# Patient Record
Sex: Female | Born: 1950 | Race: White | Hispanic: No | Marital: Single | Smoking: Former smoker
Health system: Southern US, Community
[De-identification: ages and names within clinical notes are randomized; demographics above are authoritative.]

## PROBLEM LIST (undated history)

## (undated) DIAGNOSIS — Z87891 Personal history of nicotine dependence: Secondary | ICD-10-CM

## (undated) DIAGNOSIS — Z923 Personal history of irradiation: Secondary | ICD-10-CM

## (undated) DIAGNOSIS — C50919 Malignant neoplasm of unspecified site of unspecified female breast: Secondary | ICD-10-CM

## (undated) DIAGNOSIS — Z79811 Long term (current) use of aromatase inhibitors: Secondary | ICD-10-CM

## (undated) DIAGNOSIS — M8580 Other specified disorders of bone density and structure, unspecified site: Secondary | ICD-10-CM

## (undated) DIAGNOSIS — E785 Hyperlipidemia, unspecified: Secondary | ICD-10-CM

## (undated) DIAGNOSIS — Z972 Presence of dental prosthetic device (complete) (partial): Secondary | ICD-10-CM

## (undated) DIAGNOSIS — E559 Vitamin D deficiency, unspecified: Secondary | ICD-10-CM

## (undated) DIAGNOSIS — M1990 Unspecified osteoarthritis, unspecified site: Secondary | ICD-10-CM

## (undated) HISTORY — DX: Unspecified osteoarthritis, unspecified site: M19.90

## (undated) HISTORY — DX: Vitamin D deficiency, unspecified: E55.9

## (undated) HISTORY — DX: Hyperlipidemia, unspecified: E78.5

## (undated) HISTORY — DX: Presence of dental prosthetic device (complete) (partial): Z97.2

## (undated) HISTORY — DX: Personal history of irradiation: Z92.3

## (undated) HISTORY — DX: Malignant neoplasm of unspecified site of unspecified female breast: C50.919

## (undated) HISTORY — DX: Long term (current) use of aromatase inhibitors: Z79.811

## (undated) HISTORY — DX: Other specified disorders of bone density and structure, unspecified site: M85.80

## (undated) HISTORY — DX: Personal history of nicotine dependence: Z87.891

## (undated) HISTORY — PX: NECK SURGERY: SHX720

---

## 1977-08-29 HISTORY — PX: BREAST LUMPECTOMY: SHX2

## 1989-08-29 HISTORY — PX: APPENDECTOMY: SHX54

## 1989-08-29 HISTORY — PX: OVARIAN CYST REMOVAL: SHX89

## 1998-01-22 ENCOUNTER — Other Ambulatory Visit: Admission: RE | Admit: 1998-01-22 | Discharge: 1998-01-22 | Payer: Self-pay | Admitting: Obstetrics and Gynecology

## 1998-01-30 ENCOUNTER — Ambulatory Visit (HOSPITAL_COMMUNITY): Admission: RE | Admit: 1998-01-30 | Discharge: 1998-01-30 | Payer: Self-pay | Admitting: *Deleted

## 1998-02-26 ENCOUNTER — Ambulatory Visit (HOSPITAL_COMMUNITY): Admission: RE | Admit: 1998-02-26 | Discharge: 1998-02-26 | Payer: Self-pay | Admitting: Obstetrics and Gynecology

## 1999-02-12 ENCOUNTER — Ambulatory Visit (HOSPITAL_COMMUNITY): Admission: RE | Admit: 1999-02-12 | Discharge: 1999-02-12 | Payer: Self-pay | Admitting: Obstetrics and Gynecology

## 1999-02-23 ENCOUNTER — Encounter: Payer: Self-pay | Admitting: Obstetrics and Gynecology

## 1999-02-23 ENCOUNTER — Ambulatory Visit (HOSPITAL_COMMUNITY): Admission: RE | Admit: 1999-02-23 | Discharge: 1999-02-23 | Payer: Self-pay | Admitting: Obstetrics and Gynecology

## 1999-08-03 ENCOUNTER — Other Ambulatory Visit: Admission: RE | Admit: 1999-08-03 | Discharge: 1999-08-03 | Payer: Self-pay | Admitting: Obstetrics and Gynecology

## 2000-02-24 ENCOUNTER — Encounter: Payer: Self-pay | Admitting: Obstetrics and Gynecology

## 2000-02-24 ENCOUNTER — Ambulatory Visit (HOSPITAL_COMMUNITY): Admission: RE | Admit: 2000-02-24 | Discharge: 2000-02-24 | Payer: Self-pay | Admitting: Obstetrics and Gynecology

## 2000-03-08 ENCOUNTER — Emergency Department (HOSPITAL_COMMUNITY): Admission: EM | Admit: 2000-03-08 | Discharge: 2000-03-08 | Payer: Self-pay | Admitting: Emergency Medicine

## 2000-04-19 ENCOUNTER — Ambulatory Visit (HOSPITAL_COMMUNITY): Admission: RE | Admit: 2000-04-19 | Discharge: 2000-04-19 | Payer: Self-pay | Admitting: Family Medicine

## 2000-04-19 ENCOUNTER — Encounter: Payer: Self-pay | Admitting: Family Medicine

## 2001-02-28 ENCOUNTER — Ambulatory Visit (HOSPITAL_COMMUNITY): Admission: RE | Admit: 2001-02-28 | Discharge: 2001-02-28 | Payer: Self-pay | Admitting: Emergency Medicine

## 2001-02-28 ENCOUNTER — Encounter: Payer: Self-pay | Admitting: Obstetrics and Gynecology

## 2001-03-11 ENCOUNTER — Emergency Department (HOSPITAL_COMMUNITY): Admission: EM | Admit: 2001-03-11 | Discharge: 2001-03-12 | Payer: Self-pay | Admitting: Emergency Medicine

## 2001-03-14 ENCOUNTER — Emergency Department (HOSPITAL_COMMUNITY): Admission: EM | Admit: 2001-03-14 | Discharge: 2001-03-14 | Payer: Self-pay | Admitting: Emergency Medicine

## 2001-03-14 ENCOUNTER — Encounter: Payer: Self-pay | Admitting: Internal Medicine

## 2001-03-21 ENCOUNTER — Other Ambulatory Visit: Admission: RE | Admit: 2001-03-21 | Discharge: 2001-03-21 | Payer: Self-pay | Admitting: *Deleted

## 2002-03-07 ENCOUNTER — Encounter: Payer: Self-pay | Admitting: Obstetrics and Gynecology

## 2002-03-07 ENCOUNTER — Ambulatory Visit (HOSPITAL_COMMUNITY): Admission: RE | Admit: 2002-03-07 | Discharge: 2002-03-07 | Payer: Self-pay | Admitting: Obstetrics and Gynecology

## 2003-03-13 ENCOUNTER — Ambulatory Visit (HOSPITAL_COMMUNITY): Admission: RE | Admit: 2003-03-13 | Discharge: 2003-03-13 | Payer: Self-pay | Admitting: Obstetrics and Gynecology

## 2003-03-13 ENCOUNTER — Encounter: Payer: Self-pay | Admitting: Obstetrics and Gynecology

## 2004-07-01 ENCOUNTER — Other Ambulatory Visit: Admission: RE | Admit: 2004-07-01 | Discharge: 2004-07-01 | Payer: Self-pay | Admitting: Obstetrics and Gynecology

## 2004-07-06 ENCOUNTER — Ambulatory Visit (HOSPITAL_COMMUNITY): Admission: RE | Admit: 2004-07-06 | Discharge: 2004-07-06 | Payer: Self-pay | Admitting: *Deleted

## 2005-10-11 ENCOUNTER — Other Ambulatory Visit: Admission: RE | Admit: 2005-10-11 | Discharge: 2005-10-11 | Payer: Self-pay | Admitting: Obstetrics & Gynecology

## 2005-11-03 ENCOUNTER — Ambulatory Visit (HOSPITAL_COMMUNITY): Admission: RE | Admit: 2005-11-03 | Discharge: 2005-11-03 | Payer: Self-pay | Admitting: Obstetrics and Gynecology

## 2005-12-13 ENCOUNTER — Encounter (INDEPENDENT_AMBULATORY_CARE_PROVIDER_SITE_OTHER): Payer: Self-pay | Admitting: Specialist

## 2005-12-13 ENCOUNTER — Ambulatory Visit (HOSPITAL_BASED_OUTPATIENT_CLINIC_OR_DEPARTMENT_OTHER): Admission: RE | Admit: 2005-12-13 | Discharge: 2005-12-13 | Payer: Self-pay | Admitting: Obstetrics & Gynecology

## 2005-12-13 HISTORY — PX: HYSTEROSCOPY W/D&C: SHX1775

## 2006-06-12 ENCOUNTER — Ambulatory Visit (HOSPITAL_COMMUNITY): Admission: RE | Admit: 2006-06-12 | Discharge: 2006-06-13 | Payer: Self-pay | Admitting: Neurosurgery

## 2006-06-12 HISTORY — PX: ANTERIOR CERVICAL DISCECTOMY: SHX1160

## 2006-10-19 ENCOUNTER — Other Ambulatory Visit: Admission: RE | Admit: 2006-10-19 | Discharge: 2006-10-19 | Payer: Self-pay | Admitting: Obstetrics & Gynecology

## 2006-11-06 ENCOUNTER — Ambulatory Visit (HOSPITAL_COMMUNITY): Admission: RE | Admit: 2006-11-06 | Discharge: 2006-11-06 | Payer: Self-pay | Admitting: Obstetrics and Gynecology

## 2007-11-05 ENCOUNTER — Other Ambulatory Visit: Admission: RE | Admit: 2007-11-05 | Discharge: 2007-11-05 | Payer: Self-pay | Admitting: Obstetrics and Gynecology

## 2008-11-10 ENCOUNTER — Other Ambulatory Visit: Admission: RE | Admit: 2008-11-10 | Discharge: 2008-11-10 | Payer: Self-pay | Admitting: Obstetrics & Gynecology

## 2011-01-14 NOTE — Op Note (Signed)
NAME:  Cindy Skinner, Cindy Skinner              ACCOUNT NO.:  [id number]   MEDICAL RECORD NO.:  [id number]          PATIENT TYPE:  AMB   LOCATION:  [facility]                         FACILITY:  [facility]   PHYSICIAN:  M. Leda Quail, MD  DATE OF BIRTH:  [birth year]   DATE OF PROCEDURE:  12/13/2005  DATE OF DISCHARGE:                                 OPERATIVE REPORT   PREOPERATIVE DIAGNOSES:  1.  60 year-old G0 single white female with endometrial cells on a      Pap smear.  2.  Possible endometrial polyp.  3.  Unable to do sonohistogram in the office secondary to cervical stenosis      and nulliparity.   POSTOPERATIVE DIAGNOSES:  1.  60 year-old G0 single white female with endometrial cells on a      Pap smear.  2.  Possible endometrial polyp.  3.  Unable to do sonohistogram in the office secondary to cervical stenosis      and nulliparity.   PROCEDURE:  Hysteroscopy with D&C.   SURGEON:  M. Leda Quail, MD.   ANESTHESIA:  General endotracheal.   SPECIMENS:  Endometrial curettings.   EBL:  Minimal.   URINE OUTPUT:  No catheterization was performed at the end of procedure.   FLUIDS:  Approximately 600 mL of LR.   FLUID DEFICIT:  Sorbitol 55 mL.   COMPLICATIONS:  None.   INDICATIONS:  Ms. Lanese is a 60 year old G0 single white female with  endometrial cells on a Pap smear.  The endometrial biopsy as well as  sonohistogram was attempted; however, the patient is nulliparous and I could  not dilate the cervix in the clinic.  After multiple attempts were made,  decision was to go ahead and proceed with a hysteroscopy and D&C in the  operating room while the patient was under anesthesia.  The patient was in  agreement with this.  Risks and benefits were discussed in the clinic.   PROCEDURE:  Patient was taken to the operating room.  She was placed in the  supine position, in the dorsal lithotomy position, was placed on the Central  stirrups.  The abdomen and perineum  entered, the thighs and vagina were  prepped and draped in normal sterile fashion.  A red rubber Foley catheter  was used to drain the bladder of all urine.  A short heavy weighted speculum  was placed in the posterior aspect of vagina.  The Sims retractor was used  to visualize the cervix.  The anterior lip of the cervix was grasped with a  single-tooth tenaculum.  The cervix was retracted toward the operator to  stretch the uterus and the cervix and using Pratt dilators the cervix  actually dilated fairly easily considering the difficulty with which I  encountered in the clinic.  The cervix was dilated up to a #22 Pratt  dilator.  The uterus was sounded at this point to 6-3/4 cm.  Then using a  diagnostic hysteroscope with a 12 degree angle tip attempt was made to pass  this through the dilated cervix.  There was some difficulty and so  the  cervix was dilated up to a #23.  At this point, the scope could be placed  through the cervical canal without difficulty.  The cervical canal and  endometrial cavity was visualized and photo documentation was made.  There  was no distinct endometrial polyps that were noted.  There was some fluffy  endometrium present, however.  At this time decision was made to go ahead  and proceed with D&C for endometrial sampling.  With a #1 smooth curet, the  endometrial cavity was curetted in all quadrants until a rough, gritty  texture was noted.  At this point, the hysteroscope was placed back into the  cervical canal.  Sorbitol was used as the hysteroscopic medium and an  automatic counter attached to sunction was present to ensure good deficit  calculation was made.  At this point, the endometrial cavity appeared rough  as it had been curetted; again, no distinct endometrial polyp was noted.  At  this point, I felt that the procedure should be terminated as no polyp was  present and a good endometrial sampling had been obtained.  Hysteroscope was  removed from  the cervix, the sorbitol deficit was 55 mL.  The single-tooth  tenaculum was removed from the anterior lip of the cervix and there was some  bleeding noted from the tenaculum sites.  Silver nitrate was used to obtain  excellent hemostasis at the tenaculum sites.  All instruments were removed  from the vagina.  The patient was positioned back in the supine position.  Perineum was cleaned of Betadine.   Sponge, lap, needle and instrument counts were correct x2.  The patient  tolerates procedure well.  She was extubated from general endotracheal  anesthesia without difficulty and taken to the recovery room in stable  condition.      Lum Keas, MD  Electronically Signed     MSM/MEDQ  D:  12/13/2005  T:  12/13/2005  Job:  [id number]

## 2011-01-14 NOTE — Op Note (Signed)
NAME:  Cindy Skinner, Cindy Skinner              ACCOUNT NO.:  [id number]   MEDICAL RECORD NO.:  [id number]          PATIENT TYPE:  AMB   LOCATION:  [facility]                          FACILITY:  [facility]   PHYSICIAN:  Donalee Citrin, M.D.        DATE OF BIRTH:  11/24/1950   DATE OF PROCEDURE:  06/12/2006  DATE OF DISCHARGE:                                 OPERATIVE REPORT   PREOPERATIVE DIAGNOSIS:  C7 radiculopathy, left.   POSTOPERATIVE DIAGNOSIS:  C7 radiculopathy, left.   PROCEDURE:  Anterior cervical diskectomy and fusion, C6-7, using an 8-mm  Synthes bone wedge and a 25-mm Venture plate with four 13-mm variable-angle  screws.   SURGEON:  Donalee Citrin, M.D.   ANESTHESIA:  General endotracheal.   HISTORY OF PRESENT ILLNESS:  The patient is a very pleasant 60 year old  female, who has had longstanding neck and left arm pain, going down the  first fingers of the left hand, consistent with a C7 neuropraxia.  The  patient had weakness of her triceps.  The patient had presented with MRI  scan showing compression of the C7 nerve roots, as well as EMG showing C7  radiculopathy.  Given the patient's failure of conservative treatment, the  patient was recommended anterior cervical diskectomy and fusion.  The risks  and benefits were explained to the patient.  She understands and agrees to  proceed forward.   DESCRIPTION OF PROCEDURE:  The patient was brought to the OR and was induced  under general anesthesia and was positioned supine with the neck in slight  extension  with 5 pounds of halter traction.  The right side of her neck was  prepped and drape in the usual sterile fashion.  After prepping and draping,  we localized the C6-7 interspace.  A curvilinear incision was made just off  the midline to the anterior border of the sternocleidomastoid.  The  superficial layer of the platysma was dissected out and divided  longitudinally.  The avascular plane between the sternocleidomastoid and the  strap muscles  was developed down to prevertebral fascia.  The prevertebral  fascia was dissected with the Kitners.  Intraoperative x-ray confirmed  localization at the C6-7 disk space.  Annulotomy was then achieved with a 10-  blade scalpel, and then the longus colli was reflected laterally and a self-  retaining retractor was placed.  At this point, the annulotomy was extended.  A large osteophyte coming off the C6 vertebral body was bitten away with a 3-  mm Kerrison punch.  Then, using a high-speed drill, the interspace was  drilled down to the posterior annulus and the posterior osteophytic complex.  At this point, the operating microscope was draped and brought into the  field for microscope illumination.  The large osteophyte coming from the C6  vertebral body was underbitten with a 1-mm Kerrison punch, as well as the  endplate behind C7, which was also underbitten to expose the posterior  longitudinal ligament, which was removed in piecemeal fashion, exposing the  thecal sac.  The left side of the thecal sac, spinal canal and proximal left  C7 neural foramina were under a significant amount of compression from a  large osteophyte coming off the uncinate process and the endplates.  This  was all underbitten with the 2 and 3-mm Kerrison punches.,  The C7 nerve  root was identified and skeletonized significantly out its foramina.  The C7  pedicle was identified and the foramina was cleaned up to further  skeletonized the nerve root along the course of the pedicle.  Then, the  endplates were further underbitten, decompressing the proximal right C7  neural foramen.  And at this point, the endplates were scraped.  The thecal  sac, central canal and both neural foramen we widely decompressed.  Attention was taken to the placement of the interbody wedge.  An 8-mm graft  was sized and inserted 1 mm deep to the anterior vertebral body level.  A 25-  mm Venture plating system was then inserted.  All screws  were drilled and  tapped in place.  All screws had excellent purchase.  The locking mechanism  was engaged.  The wound was copiously irrigated and meticulous hemostasis  was maintained.  The platysma was reapproximated with 3-0 interrupted  Vicryl, and the skin was closed with a running, 4-0 subcuticular.  Benzoin  and Steri-Strips were applied.  The patient went to the recovery room in  stable condition.  At the end of the case, needle counts were correct.           ______________________________  Donalee Citrin, M.D.     GC/MEDQ  D:  06/12/2006  T:  06/12/2006  Job:  [id number]

## 2011-11-08 ENCOUNTER — Other Ambulatory Visit: Payer: Self-pay | Admitting: Obstetrics and Gynecology

## 2011-11-08 DIAGNOSIS — Z780 Asymptomatic menopausal state: Secondary | ICD-10-CM

## 2011-11-08 DIAGNOSIS — Z1231 Encounter for screening mammogram for malignant neoplasm of breast: Secondary | ICD-10-CM

## 2011-12-08 ENCOUNTER — Ambulatory Visit (HOSPITAL_COMMUNITY)
Admission: RE | Admit: 2011-12-08 | Discharge: 2011-12-08 | Disposition: A | Discharge status: Home / Self Care | Payer: BC Managed Care – PPO | Source: Ambulatory Visit | Attending: Obstetrics and Gynecology | Admitting: Obstetrics and Gynecology

## 2011-12-08 DIAGNOSIS — Z780 Asymptomatic menopausal state: Secondary | ICD-10-CM

## 2011-12-08 DIAGNOSIS — Z1231 Encounter for screening mammogram for malignant neoplasm of breast: Secondary | ICD-10-CM

## 2011-12-08 DIAGNOSIS — M8580 Other specified disorders of bone density and structure, unspecified site: Secondary | ICD-10-CM

## 2011-12-08 DIAGNOSIS — Z13820 Encounter for screening for osteoporosis: Secondary | ICD-10-CM | POA: Insufficient documentation

## 2011-12-08 HISTORY — DX: Other specified disorders of bone density and structure, unspecified site: M85.80

## 2011-12-26 ENCOUNTER — Other Ambulatory Visit: Payer: Self-pay | Admitting: Obstetrics and Gynecology

## 2011-12-26 DIAGNOSIS — R928 Other abnormal and inconclusive findings on diagnostic imaging of breast: Secondary | ICD-10-CM

## 2011-12-28 ENCOUNTER — Other Ambulatory Visit: Payer: Self-pay | Admitting: Obstetrics and Gynecology

## 2011-12-28 ENCOUNTER — Ambulatory Visit
Admission: RE | Admit: 2011-12-28 | Discharge: 2011-12-28 | Disposition: A | Discharge status: Home / Self Care | Payer: BC Managed Care – PPO | Source: Ambulatory Visit | Attending: Obstetrics and Gynecology | Admitting: Obstetrics and Gynecology

## 2011-12-28 DIAGNOSIS — R928 Other abnormal and inconclusive findings on diagnostic imaging of breast: Secondary | ICD-10-CM

## 2011-12-28 DIAGNOSIS — C50919 Malignant neoplasm of unspecified site of unspecified female breast: Secondary | ICD-10-CM

## 2011-12-28 DIAGNOSIS — N6310 Unspecified lump in the right breast, unspecified quadrant: Secondary | ICD-10-CM

## 2011-12-28 HISTORY — DX: Malignant neoplasm of unspecified site of unspecified female breast: C50.919

## 2012-01-02 ENCOUNTER — Ambulatory Visit
Admission: RE | Admit: 2012-01-02 | Discharge: 2012-01-02 | Disposition: A | Discharge status: Home / Self Care | Payer: BC Managed Care – PPO | Source: Ambulatory Visit | Attending: Obstetrics and Gynecology | Admitting: Obstetrics and Gynecology

## 2012-01-02 ENCOUNTER — Other Ambulatory Visit: Payer: Self-pay | Admitting: Obstetrics and Gynecology

## 2012-01-02 DIAGNOSIS — N6310 Unspecified lump in the right breast, unspecified quadrant: Secondary | ICD-10-CM

## 2012-01-02 DIAGNOSIS — N63 Unspecified lump in unspecified breast: Secondary | ICD-10-CM

## 2012-01-02 HISTORY — PX: BREAST BIOPSY: SHX20

## 2012-01-03 ENCOUNTER — Ambulatory Visit
Admission: RE | Admit: 2012-01-03 | Discharge: 2012-01-03 | Disposition: A | Discharge status: Home / Self Care | Payer: BC Managed Care – PPO | Source: Ambulatory Visit | Attending: Obstetrics and Gynecology | Admitting: Obstetrics and Gynecology

## 2012-01-03 ENCOUNTER — Other Ambulatory Visit: Payer: Self-pay | Admitting: Obstetrics and Gynecology

## 2012-01-03 DIAGNOSIS — N63 Unspecified lump in unspecified breast: Secondary | ICD-10-CM

## 2012-01-03 DIAGNOSIS — C50911 Malignant neoplasm of unspecified site of right female breast: Secondary | ICD-10-CM

## 2012-01-05 ENCOUNTER — Other Ambulatory Visit: Payer: Self-pay | Admitting: *Deleted

## 2012-01-05 ENCOUNTER — Telehealth: Payer: Self-pay | Admitting: *Deleted

## 2012-01-05 DIAGNOSIS — C50311 Malignant neoplasm of lower-inner quadrant of right female breast: Secondary | ICD-10-CM | POA: Insufficient documentation

## 2012-01-05 DIAGNOSIS — C50319 Malignant neoplasm of lower-inner quadrant of unspecified female breast: Secondary | ICD-10-CM

## 2012-01-05 NOTE — Telephone Encounter (Signed)
Confirmed [facility] for 01/11/12 at 0800 .  Instructions and contact information given.

## 2012-01-07 ENCOUNTER — Other Ambulatory Visit: Payer: BC Managed Care – PPO

## 2012-01-10 ENCOUNTER — Ambulatory Visit
Admission: RE | Admit: 2012-01-10 | Discharge: 2012-01-10 | Disposition: A | Discharge status: Home / Self Care | Payer: BC Managed Care – PPO | Source: Ambulatory Visit | Attending: Obstetrics and Gynecology | Admitting: Obstetrics and Gynecology

## 2012-01-10 DIAGNOSIS — C50911 Malignant neoplasm of unspecified site of right female breast: Secondary | ICD-10-CM

## 2012-01-10 MED ORDER — GADOBENATE DIMEGLUMINE 529 MG/ML IV SOLN: 14.0000 mL | Freq: Once | INTRAVENOUS | Status: AC | PRN

## 2012-01-11 ENCOUNTER — Ambulatory Visit (HOSPITAL_BASED_OUTPATIENT_CLINIC_OR_DEPARTMENT_OTHER): Payer: BC Managed Care – PPO | Admitting: Surgery

## 2012-01-11 ENCOUNTER — Encounter: Payer: Self-pay | Admitting: *Deleted

## 2012-01-11 ENCOUNTER — Other Ambulatory Visit (HOSPITAL_COMMUNITY): Payer: Self-pay | Admitting: Surgery

## 2012-01-11 ENCOUNTER — Encounter: Payer: Self-pay | Admitting: Oncology

## 2012-01-11 ENCOUNTER — Ambulatory Visit
Admission: RE | Admit: 2012-01-11 | Discharge: 2012-01-11 | Disposition: A | Discharge status: Home / Self Care | Payer: BC Managed Care – PPO | Source: Ambulatory Visit | Attending: Radiation Oncology | Admitting: Radiation Oncology

## 2012-01-11 ENCOUNTER — Ambulatory Visit: Payer: BC Managed Care – PPO | Attending: Surgery | Admitting: Physical Therapy

## 2012-01-11 ENCOUNTER — Ambulatory Visit (HOSPITAL_BASED_OUTPATIENT_CLINIC_OR_DEPARTMENT_OTHER): Payer: BC Managed Care – PPO | Admitting: Oncology

## 2012-01-11 ENCOUNTER — Encounter: Payer: Self-pay | Admitting: Specialist

## 2012-01-11 ENCOUNTER — Ambulatory Visit: Payer: BC Managed Care – PPO

## 2012-01-11 ENCOUNTER — Other Ambulatory Visit (HOSPITAL_BASED_OUTPATIENT_CLINIC_OR_DEPARTMENT_OTHER): Payer: BC Managed Care – PPO

## 2012-01-11 VITALS — BP 111/75 | HR 74 | Temp 98.1°F | Ht 66.2 in | Wt 165.1 lb

## 2012-01-11 DIAGNOSIS — C50319 Malignant neoplasm of lower-inner quadrant of unspecified female breast: Secondary | ICD-10-CM

## 2012-01-11 DIAGNOSIS — C50919 Malignant neoplasm of unspecified site of unspecified female breast: Secondary | ICD-10-CM

## 2012-01-11 DIAGNOSIS — IMO0001 Reserved for inherently not codable concepts without codable children: Secondary | ICD-10-CM | POA: Insufficient documentation

## 2012-01-11 DIAGNOSIS — M4000 Postural kyphosis, site unspecified: Secondary | ICD-10-CM | POA: Insufficient documentation

## 2012-01-11 DIAGNOSIS — R293 Abnormal posture: Secondary | ICD-10-CM | POA: Insufficient documentation

## 2012-01-11 LAB — CBC WITH DIFFERENTIAL/PLATELET
BASO%: 0.9 % (ref 0.0–2.0)
EOS%: 4.5 % (ref 0.0–7.0)
HCT: 42.1 % (ref 34.8–46.6)
LYMPH%: 35.3 % (ref 14.0–49.7)
MCH: 31.9 pg (ref 25.1–34.0)
MCHC: 33.9 g/dL (ref 31.5–36.0)
MONO%: 8.2 % (ref 0.0–14.0)
NEUT%: 51.1 % (ref 38.4–76.8)
Platelets: 250 10*3/uL (ref 145–400)
lymph#: 1.5 10*3/uL (ref 0.9–3.3)

## 2012-01-11 LAB — COMPREHENSIVE METABOLIC PANEL
AST: 20 U/L (ref 0–37)
Albumin: 3.7 g/dL (ref 3.5–5.2)
Alkaline Phosphatase: 86 U/L (ref 39–117)
BUN: 16 mg/dL (ref 6–23)
Calcium: 9.7 mg/dL (ref 8.4–10.5)
Creatinine, Ser: 0.87 mg/dL (ref 0.50–1.10)
Total Bilirubin: 0.7 mg/dL (ref 0.3–1.2)
Total Protein: 7.3 g/dL (ref 6.0–8.3)

## 2012-01-11 LAB — CANCER ANTIGEN 27.29: CA 27.29: 33 U/mL (ref 0–39)

## 2012-01-11 NOTE — Progress Notes (Signed)
Radiation Oncology         [phone] ________________________________  Initial outpatient Consultation  Name: Cindy Skinner MRN: [id number]  Date: 01/11/2012  DOB: 1951/07/30  CC: Kandis Cocking, MD   REFERRING PHYSICIAN: Kandis Cocking, MD  DIAGNOSIS: Right breast cancer  HISTORY OF PRESENT ILLNESS::Cindy Skinner is a 61 y.o. female who is  seen today in multidisciplinary clinic for consultation regarding her newly diagnosed right breast cancer. She recently presented for screening mammogram and was noted to have a new right breast mass measuring 1 cm at the 1:00 position. An ultrasound confirmed the presence of this mass. 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 Ki 67 was 19%. HER-2/neu was negative. An MRI of the bilateral breast showed a 1.1 cm area. The left breast was negative and no enlarged axillary or internal mammary lymph nodes were noted. She has no complaints today.  PREVIOUS RADIATION THERAPY: No  PAST MEDICAL HISTORY:  has a past medical history of H/O colonoscopy; H/O bone density study (10/2011); Contact lens/glasses fitting; Night sweats; Wears dentures; Sinus complaint; Pain; Arthritis; and Hot flashes.    PAST SURGICAL HISTORY: Past Surgical History  Procedure Date  . Appendectomy   . Neck surgery   . Breast surgery     FAMILY HISTORY: family history includes Lung cancer in her maternal aunt; Ovarian cancer in her sister; and Uterine cancer in her sister.  SOCIAL HISTORY:  does not have a smoking history on file. She uses smokeless tobacco. She reports that she does not use illicit drugs.  ALLERGIES: Review of patient's allergies indicates no known allergies.  MEDICATIONS:  Current Outpatient Prescriptions  Medication Sig Dispense Refill  . buPROPion (WELLBUTRIN XL) 150 MG 24 hr tablet Take 150 mg by mouth daily.      . calcium-vitamin D (OSCAL WITH D) 500-200 MG-UNIT per  tablet Take 2 tablets by mouth daily.      . ergocalciferol (VITAMIN D2) 50000 UNITS capsule Take 50,000 Units by mouth once a week.       No current facility-administered medications for this encounter.   Facility-Administered Medications Ordered in Other Encounters  Medication Dose Route Frequency Provider Last Rate Last Dose  . gadobenate dimeglumine (MULTIHANCE) injection 14 mL  14 mL Intravenous Once PRN Medication Radiologist, MD        REVIEW OF SYSTEMS:  A 15 point review of systems is documented in the electronic medical record. This was obtained by the nursing staff. However, I reviewed this with the patient to discuss relevant findings and make appropriate changes.  Pertinent items are noted in HPI.   PHYSICAL EXAM: Wt Readings from Last 3 Encounters:  01/11/12 165 lb 1.6 oz (74.889 kg)   Temp Readings from Last 3 Encounters:  01/11/12 98.1 F (36.7 C)    BP Readings from Last 3 Encounters:  01/11/12 111/75   Pulse Readings from Last 3 Encounters:  01/11/12 74   She is a pleasant female in no distress sitting comfortably examining table. She appears older than her stated age. She has no palpable cervical supraclavicular or axillary adenopathy. Examination of the left breast reveals no palpable abnormalities. Examination of the right breast reveals some bruising associated with biopsy change. No other palpable abnormalities. She is alert and oriented x3. She is 5 out of 5 strength in her bilateral upper extremities.  LABORATORY DATA:  Lab Results  Component Value Date  WBC 4.4 01/11/2012   HGB 14.3 01/11/2012   HCT 42.1 01/11/2012   MCV 94.0 01/11/2012   PLT 250 01/11/2012   Lab Results  Component Value Date   NA 142 01/11/2012   K 3.7 01/11/2012   CL 103 01/11/2012   CO2 30 01/11/2012   Lab Results  Component Value Date   ALT 23 01/11/2012   AST 20 01/11/2012   ALKPHOS 86 01/11/2012   BILITOT 0.7 01/11/2012     RADIOGRAPHY: US Breast Right  12/30/2011  *RADIOLOGY  REPORT*  Clinical Data:  Called back from screening mammography, possible right breast mass visualized only on the MLO view.  DIGITAL DIAGNOSTIC RIGHT MAMMOGRAM WITH CAD AND RIGHT BREAST ULTRASOUND:  Comparison:  12/08/2011, 12/18/2008, dating back to 11/03/2005.  Findings:  Laterally exaggerated CC view, MLO view, and spot compression MLO view of the right breast were obtained.  The area of concern on screening mammography persists on follow-up imaging, and is consistent with a dense, spiculated mass in the posterior third of the upper breast measuring maximally 9 mm.  The mass is not identified on the laterally exaggerated CC view.  No associated microcalcifications. Mammographic images were processed with CAD.  On physical exam, I palpate a vague mass in the medial portion of the superior most right breast.  Ultrasound is performed, showing a vertically oriented irregular hypoechoic mass in the 1 o'clock position of the right breast, approximately 12 cm from the nipple, with its posterior margin closely opposed to the pectoralis muscle.  The mass measures approximately 1.0 x 0.7 x 0.4 cm, and demonstrates slight acoustic shadowing.  IMPRESSION: Suspicious mass in the 1 o'clock position of the right breast, approximate 12 cm from the nipple, with a posterior margin that closely abuts the right pectoralis muscle.  Recommendation:  Ultrasound guided core needle biopsy is recommended.  This was discussed with the patient who agrees to proceed.  This has been scheduled for May 6 at 11 o'clock a.m.  BI-RADS CATEGORY 4:  Suspicious abnormality - biopsy should be considered.  Original Report Authenticated By: Arnell Sieving, M.D.   Mr Breast Bilateral W Wo Contrast  01/10/2012  *RADIOLOGY REPORT*  Clinical Data: New diagnosis right-sided breast cancer.  BILATERAL BREAST MRI WITH AND WITHOUT CONTRAST  Technique: Multiplanar, multisequence MR images of both breasts were obtained prior to and following the  intravenous administration of 14ml of Multihance.  Three dimensional images were evaluated at the independent DynaCad workstation.  Comparison:  Most recent mammogram dated 01/02/2012  Findings: Foci of nonspecific enhancement seen bilaterally.  A round, enhancing mass with irregular margins is seen in the upper- inner quadrant of the right breast, posteriorly measuring 1.0 x 1.1 x 0.8 cm corresponding to the biopsy-proven malignancy. There is no involvement of the pectoralis muscle.  A biopsy clip is seen in association with the mass.  Adjacent postbiopsy changes are seen including a 0.7 cm hematoma.  No other suspicious mass or enhancement is seen in either breast. No axillary or internal mammary adenopathy is present.  IMPRESSION: Known malignancy, right breast as detailed above.  No MRI specific evidence of malignancy, left breast.  THREE-DIMENSIONAL MR IMAGE RENDERING ON INDEPENDENT WORKSTATION:  Three-dimensional MR images were rendered by post-processing of the original MR data on an independent workstation.  The three- dimensional MR images were interpreted, and findings were reported in the accompanying complete MRI report for this study.  BI-RADS CATEGORY 6:  Known biopsy-proven malignancy - appropriate action should be taken.  Original Report Authenticated By: Hiram Gash, M.D.   [place] Core Biopsy  01/02/2012  *RADIOLOGY REPORT*  Clinical Data:  61 year old female with suspicious mass in the upper right breast - for tissue sampling.  ULTRASOUND GUIDED VACUUM ASSISTED CORE BIOPSY OF THE RIGHT BREAST  Comparison: 12/28/2011 mammogram and ultrasound.  I met with the patient and we discussed the procedure of ultrasound- guided biopsy, including benefits and alternatives.  We discussed the high likelihood of a successful procedure. We discussed the risks of the procedure, including infection, bleeding, tissue injury, clip migration, and inadequate sampling.  Informed, written consent was given.  Using  sterile technique, 2% lidocaine, ultrasound guidance, and a 12 gauge vacuum assisted needle, biopsy was performed of the 7 x 8 mm irregular hypoechoic mass at the 1 o'clock position of the right breast 12 cm from the nipple.  At the conclusion of the procedure, a tissue marker clip was deployed into the biopsy cavity.  Follow- up 2-view mammogram was performed and dictated separately.  IMPRESSION: Ultrasound-guided biopsy of suspicious right breast mass.  No apparent complications.  Pathology will be followed.  Original Report Authenticated By: Rosendo Gros, M.D.   Mm Digital Diag Ltd R  12/28/2011  *RADIOLOGY REPORT*  Clinical Data:  Called back from screening mammography, possible right breast mass visualized only on the MLO view.  DIGITAL DIAGNOSTIC RIGHT MAMMOGRAM WITH CAD AND RIGHT BREAST ULTRASOUND:  Comparison:  12/08/2011, 12/18/2008, dating back to 11/03/2005.  Findings:  Laterally exaggerated CC view, MLO view, and spot compression MLO view of the right breast were obtained.  The area of concern on screening mammography persists on follow-up imaging, and is consistent with a dense, spiculated mass in the posterior third of the upper breast measuring maximally 9 mm.  The mass is not identified on the laterally exaggerated CC view.  No associated microcalcifications. Mammographic images were processed with CAD.  On physical exam, I palpate a vague mass in the medial portion of the superior most right breast.  Ultrasound is performed, showing a vertically oriented irregular hypoechoic mass in the 1 o'clock position of the right breast, approximately 12 cm from the nipple, with its posterior margin closely opposed to the pectoralis muscle.  The mass measures approximately 1.0 x 0.7 x 0.4 cm, and demonstrates slight acoustic shadowing.  IMPRESSION: Suspicious mass in the 1 o'clock position of the right breast, approximate 12 cm from the nipple, with a posterior margin that closely abuts the right pectoralis  muscle.  Recommendation:  Ultrasound guided core needle biopsy is recommended.  This was discussed with the patient who agrees to proceed.  This has been scheduled for May 6 at 11 o'clock a.m.  BI-RADS CATEGORY 4:  Suspicious abnormality - biopsy should be considered.  Original Report Authenticated By: Arnell Sieving, M.D.   Mm Digital Diagnostic Unilat R  01/02/2012  *RADIOLOGY REPORT*  Clinical Data:  Evaluate clip placement following ultrasound guided right breast biopsy.  DIGITAL DIAGNOSTIC RIGHT MAMMOGRAM  Comparison:  12/28/2011  Findings:  Films are performed following ultrasound guided biopsy of 7 x 8 mm mass at the 1 o'clock position of the right breast 12 cm from the nipple.  The biopsy clip is in satisfactory position.  IMPRESSION: Satisfactory clip position following ultrasound guided right breast biopsy.  Pathology will be followed.  Original Report Authenticated By: Rosendo Gros, M.D.   Mm Radiologist Eval And Mgmt  01/03/2012  *RADIOLOGY REPORT*  ESTABLISHED PATIENT OFFICE VISIT - LEVEL II [phone])  Chief Complaint:  The patient returns for discussion of the pathologic findings after ultrasound-guided core needle biopsy of a mass in the 1 o'clock position of the right breast 12 cm from the right nipple.  History:  A suspicious 10 mm mass was identified at 1 o'clock 12 cm in the right nipple following screening mammography, diagnostic mammography and right breast ultrasound.  Biopsy was recommended and performed on 01/02/2012.  Exam:  On physical examination, the biopsy site in the upper portion of the right breast is clean and dry with no sign of hematoma or infection.  Assessment and Plan:  Histologic evaluation demonstrates grade II invasive ductal carcinoma.  This is concordant with the imaging findings.  Results were discussed with the patient and a family member.  She reports no complications from the procedure.  The patient is scheduled to be seen in the [facility] on 01/11/2012.  Breast MRI is scheduled for 01/07/2012. Questions were answered.  Informational materials were given.  Original Report Authenticated By: Daryl Eastern, M.D.      IMPRESSION: Newly diagnosed T1 C. N0 right breast cancer  PLAN: Ms. Nelly Rout is an excellent candidate for breast conservation. We discussed the equivalency in terms of survival between lumpectomy and mastectomy. We discussed the role of radiation and decreasing local failure in patients do choose breast conservation. We discussed the process of simulation and the placement tattoos. We discussed 3-6 weeks of treatment as an outpatient. Given her small breast size I think she would be a candidate for hypofractionation. We discussed the risk of lung damage skin irritation and fatigue. I assured her I thought she can work throughout the course of her treatment. She has met with Dr. Welton Flakes and Dr. Ezzard Standing. I will plan on seeing her back for followup in simulation after her surgery. She also met with a member of our patient family support team, our physical therapist, and a breast cancer survivor.   I spent 60 minutes minutes face to face with the patient and more than 50% of that time was spent in counseling and/or coordination of care.   ------------------------------------------------  Lurline Hare, MD

## 2012-01-11 NOTE — Progress Notes (Signed)
Patient came in today as a new patient she has one Editor, commissioning.I did give her a medicaid application to fill out.

## 2012-01-11 NOTE — Progress Notes (Signed)
Re:   Cindy Skinner DOB:   22-Mar-1951 MRN:   [id number]  [facility]  ASSESSMENT AND PLAN: 1.  Right breast cancer, T1c, N0.  IDC, MRI - 1.1 cm tumor  ER - 100, PR - 16, Ki67 - 19%, Her2 Neu - neg  Drs Welton Flakes and [facility] consulting oncology.  I discussed the options for breast cancer treatment with the patient.  I discussed the idea of a multidisciplinary approach to the treatment of breast cancer (she's in the [facility]), which often includes medical oncology and radiation oncology.  I discussed the surgical options of lumpectomy vs. mastectomy.  I discussed the options of lymph node biopsy.  The treatment plan depends on the pathologic staging of the tumor and the patient's personal wishes.  The risks of surgery include, but are not limited to, bleeding, infection, the need for further surgery, and nerve injury.  She is a candidate for right breast lumpectomy and right axillary SLNBx.  Plan:  Right breast needle loc lumpectomy and right axillary SLNBx.  Followed by rad tx and probable anti-estrogen therapy.  2.  Trying to quit smoking. 3.  Because of a sister with possible ovarian cancer, she is going for a genetics consultation.  REFERRING PHYSICIAN: Ria Comment, NP  HISTORY OF PRESENT ILLNESS: Cindy Skinner is a 61 y.o. (DOB: 08/23/1951)  "country" white female whose primary care physician is Lenor Coffin, NP, [facility], and comes the [facility] clinic for right breast cancer.  She had a right breast biopsy in her 20's for benign disease.  She had her last mammogram about 2 years ago.  She felt nothing new in her breast.  She had a mammogram on 12/28/2011 which showed a nodule deep in her right breast at the 1 o'clock position.  She underwent a right breast core biopsy on 01/02/2012 which showed an invasive ductal carcinoma.  Her MRI on 01/10/2012 showed a 1.1 x 1.0 cm mass at the 12 o'clock area of the right breast.   She has no family history of breast cancer.  She does have a  sister who had "ovarian cancer" when she was 60 yo.  She is not married.  She has no children.  She is accompanied by Junie Bame, whom she lives with.  She is not on hormones.  She is trying to quit smoking.   No past medical history on file.   No past surgical history on file.    No current outpatient prescriptions on file.   No current facility-administered medications for this visit.   Facility-Administered Medications Ordered in Other Visits  Medication Dose Route Frequency Provider Last Rate Last Dose  . gadobenate dimeglumine (MULTIHANCE) injection 14 mL  14 mL Intravenous Once PRN Medication Radiologist, MD         No Known Allergies  REVIEW OF SYSTEMS: Skin:  No history of rash.  No history of abnormal moles. Infection:  No history of hepatitis or HIV.  No history of MRSA. Neurologic:  No history of stroke.  No history of seizure.  No history of headaches. Cardiac:  No history of hypertension. No history of heart disease.  No history of prior cardiac catheterization.  No history of seeing a cardiologist. Pulmonary:  Smokes and knows it is bad for her health.  She is trying to quit.  She is on Wellbutrin.  Endocrine:  No diabetes. No thyroid disease. Gastrointestinal:  No history of stomach disease.  No history of liver disease.  No history of  gall bladder disease.  No history of pancreas disease.  No history of colon disease. Had an appendectomy in [place] in 1995 - but actually had a rupture ovarian cyst. Urologic:  No history of kidney stones.  No history of bladder infections. Musculoskeletal:  Cervical spine surgery by Dr. Wynetta Emery - 2007.  Has done well with this. Hematologic:  No bleeding disorder.  No history of anemia.  Not anticoagulated. Psycho-social:  The patient is oriented.   The patient has no obvious psychologic or social impairment to understanding our conversation and plan.  SOCIAL and FAMILY HISTORY: Works in Production designer, theatre/television/film.  She is not married nor has any  children. Geraldine Contras Mylan is with the patient.  PHYSICAL EXAM: There were no vitals taken for this visit.  General: WN WF who is alert and generally healthy appearing.  HEENT: Normal. Pupils equal. Good dentition. Neck: Supple. No mass.  No thyroid mass. Lymph Nodes:  No supraclavicular, cervical nodes or axillary nodes. Breasts:  Right:  Bruise at 12 o'clock.  Some tenderness.  I do not feel a mass.  Left:  No mass or area of concern. Lungs: Clear to auscultation and symmetric breath sounds. Heart:  RRR. No murmur or rub. Abdomen: Soft. No mass. No tenderness. No hernia. Normal bowel sounds.  No abdominal scars. Rectal: Not done. Extremities:  Good strength and ROM  in upper and lower extremities. Neurologic:  Grossly intact to motor and sensory function. Psychiatric: Has normal mood and affect. Behavior is normal.   DATA REVIEWED: MRI, mammogram, path report.  Copies of path report given to patient.  Ovidio Kin, MD,  [facility] [address] [place]  [address]   [place], [place]    [address] Phone:  [phone] FAX:  [phone]

## 2012-01-11 NOTE — Progress Notes (Unsigned)
I met the patient in today's multidisciplinary breast clinic and reviewed with her the distress screening, on which she initially indicated a "7" stress level.  She said, however, that since receiving all the information in the clinic and now having a plan of treatment, her stress level had decreased.  I told her about the support services in the [facility] and [facility], including the Breast Cancer Support Group. She did not want a referral made to [facility] at this time.

## 2012-01-11 NOTE — Patient Instructions (Signed)
Follow up with me in 5 - 6 weeks.

## 2012-01-11 NOTE — Progress Notes (Signed)
Cindy Skinner [id number] [birth year] 61 y.o. 01/11/2012 10:04 AM  CC Dr. Lurline Hare Dr. Albin Felling Dr. 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 Patient is being seen in the multidisciplinary clinic for discussion of treatment options.  STAGE:   Cancer of lower-inner quadrant of female breast   Primary site: Breast (Right)   Staging method: AJCC 7th Edition   Clinical: (T1c, NX, cM0)   Summary: (T1c, NX, cM0)  REFERRING PHYSICIAN: Dr. Ovidio Kin  HISTORY OF PRESENT ILLNESS:  Cindy Skinner is a 61 y.o. female.  With medical history significant for arthritis and joint disease. Patient is also a current smoker and is trying to quit and is on Wellbutrin. Patient recently presented for a right screening mammogram was noted to have a right breast mass measuring on mammogram 10 mm at the 1:00 position. She went on to have an ultrasound that showed invasive ductal carcinoma grade 2 ER +100% PR-positive 16% Ki-67 19% and HER-2/neu was negative at 1.7. Patient had an MRI of the breasts performed it showed a 1.1 cm area of concern but no other findings. Patient's case was discussed discussed at the multidisciplinary breast conference this morning. She is now being seen in the multidisciplinary breast clinic for treatment options. She is without any complaints.   Past Medical History: Past Medical History  Diagnosis Date  . H/O colonoscopy   . H/O bone density study 10/2011  . Contact lens/glasses fitting   . Night sweats   . Wears dentures   . Sinus complaint   . Pain     hx of back and joint pain  . Arthritis   . Hot flashes     Past Surgical History: Past Surgical History  Procedure Date  . Appendectomy   . Neck surgery   . Breast surgery     Family History: Family History  Problem Relation Age of Onset  . Ovarian  cancer Sister   . Uterine cancer Sister   . Lung cancer Maternal Aunt     Social History Patient is single she has not had any pregnancies. History  Substance Use Topics  . Smoking status: Not on file  . Smokeless tobacco: Former Neurosurgeon    Quit date: 10/28/2011  . Alcohol Use:     Allergies: No Known Allergies  Current Medications: Current Outpatient Prescriptions  Medication Sig Dispense Refill  . buPROPion (WELLBUTRIN XL) 150 MG 24 hr tablet Take 150 mg by mouth daily.      . calcium-vitamin D (OSCAL WITH D) 500-200 MG-UNIT per tablet Take 2 tablets by mouth daily.      . ergocalciferol (VITAMIN D2) 50000 UNITS capsule Take 50,000 Units by mouth once a week.       No current facility-administered medications for this visit.   Facility-Administered Medications Ordered in Other Visits  Medication Dose Route Frequency Provider Last Rate Last Dose  . gadobenate dimeglumine (MULTIHANCE) injection 14 mL  14 mL Intravenous Once PRN Medication Radiologist, MD        OB/GYN History:Menarche in her early 21s. She is nulliparous. She is not on any hormone replacement therapy she is postmenopausal.  Fertility Discussion: Patient is postmenopausal Prior History of Cancer: She has no prior history of cancers  Health Maintenance: She has had a colonoscopy as well as bone density in the last Pap smear was in March  2013.  ECOG PERFORMANCE STATUS: 0 - Asymptomatic  Genetic Counseling/testing: Patient has a sister who had ovarian cancer at 4 and then subsequently developed uterine cancer. Because of this early onset of GYN malignancies patient is recommended genetic counseling and testing   REVIEW OF SYSTEMS:  A comprehensive review of systems was negative.  PHYSICAL EXAMINATION: Blood pressure 111/75, pulse 74, temperature 98.1 F (36.7 C), height 5' 6.2" (1.681 m), weight 165 lb 1.6 oz (74.889 kg).  ZOX:WRUEA, healthy, no distress, well nourished, well developed and anxious SKIN:  skin color, texture, turgor are normal HEAD: Normocephalic EYES: PERRLA, EOMI, sclera clear EARS: External ears normal OROPHARYNX:no exudate, no erythema and lips, buccal mucosa, and tongue normal  NECK: supple, no adenopathy LYMPH:  no palpable lymphadenopathy, no hepatosplenomegaly BREAST:Bilateral breast examination is performed the right breast does reveal a palpable mass from her recent biopsy there is an area of ecchymosis no nipple discharge. Left breast no masses or nipple discharge. LUNGS: clear to auscultation and percussion HEART: regular rate & rhythm, no murmurs and no gallops ABDOMEN:abdomen soft, non-tender, normal bowel sounds and no masses or organomegaly BACK: No CVA tenderness EXTREMITIES:no edema, no clubbing, no cyanosis  NEURO: alert & oriented x 3 with fluent speech, no focal motor/sensory deficits, gait normal, reflexes normal and symmetric  STUDIES/RESULTS: US Breast Right  12/30/2011  *RADIOLOGY REPORT*  Clinical Data:  Called back from screening mammography, possible right breast mass visualized only on the MLO view.  DIGITAL DIAGNOSTIC RIGHT MAMMOGRAM WITH CAD AND RIGHT BREAST ULTRASOUND:  Comparison:  12/08/2011, 12/18/2008, dating back to 11/03/2005.  Findings:  Laterally exaggerated CC view, MLO view, and spot compression MLO view of the right breast were obtained.  The area of concern on screening mammography persists on follow-up imaging, and is consistent with a dense, spiculated mass in the posterior third of the upper breast measuring maximally 9 mm.  The mass is not identified on the laterally exaggerated CC view.  No associated microcalcifications. Mammographic images were processed with CAD.  On physical exam, I palpate a vague mass in the medial portion of the superior most right breast.  Ultrasound is performed, showing a vertically oriented irregular hypoechoic mass in the 1 o'clock position of the right breast, approximately 12 cm from the nipple, with its  posterior margin closely opposed to the pectoralis muscle.  The mass measures approximately 1.0 x 0.7 x 0.4 cm, and demonstrates slight acoustic shadowing.  IMPRESSION: Suspicious mass in the 1 o'clock position of the right breast, approximate 12 cm from the nipple, with a posterior margin that closely abuts the right pectoralis muscle.  Recommendation:  Ultrasound guided core needle biopsy is recommended.  This was discussed with the patient who agrees to proceed.  This has been scheduled for May 6 at 11 o'clock a.m.  BI-RADS CATEGORY 4:  Suspicious abnormality - biopsy should be considered.  Original Report Authenticated By: Arnell Sieving, M.D.   Mr Breast Bilateral W Wo Contrast  01/10/2012  *RADIOLOGY REPORT*  Clinical Data: New diagnosis right-sided breast cancer.  BILATERAL BREAST MRI WITH AND WITHOUT CONTRAST  Technique: Multiplanar, multisequence MR images of both breasts were obtained prior to and following the intravenous administration of 14ml of Multihance.  Three dimensional images were evaluated at the independent DynaCad workstation.  Comparison:  Most recent mammogram dated 01/02/2012  Findings: Foci of nonspecific enhancement seen bilaterally.  A round, enhancing mass with irregular margins is seen in the upper- inner quadrant of the right breast, posteriorly  measuring 1.0 x 1.1 x 0.8 cm corresponding to the biopsy-proven malignancy. There is no involvement of the pectoralis muscle.  A biopsy clip is seen in association with the mass.  Adjacent postbiopsy changes are seen including a 0.7 cm hematoma.  No other suspicious mass or enhancement is seen in either breast. No axillary or internal mammary adenopathy is present.  IMPRESSION: Known malignancy, right breast as detailed above.  No MRI specific evidence of malignancy, left breast.  THREE-DIMENSIONAL MR IMAGE RENDERING ON INDEPENDENT WORKSTATION:  Three-dimensional MR images were rendered by post-processing of the original MR data on an  independent workstation.  The three- dimensional MR images were interpreted, and findings were reported in the accompanying complete MRI report for this study.  BI-RADS CATEGORY 6:  Known biopsy-proven malignancy - appropriate action should be taken.  Original Report Authenticated By: Hiram Gash, M.D.   [place] Core Biopsy  01/02/2012  *RADIOLOGY REPORT*  Clinical Data:  61 year old female with suspicious mass in the upper right breast - for tissue sampling.  ULTRASOUND GUIDED VACUUM ASSISTED CORE BIOPSY OF THE RIGHT BREAST  Comparison: 12/28/2011 mammogram and ultrasound.  I met with the patient and we discussed the procedure of ultrasound- guided biopsy, including benefits and alternatives.  We discussed the high likelihood of a successful procedure. We discussed the risks of the procedure, including infection, bleeding, tissue injury, clip migration, and inadequate sampling.  Informed, written consent was given.  Using sterile technique, 2% lidocaine, ultrasound guidance, and a 12 gauge vacuum assisted needle, biopsy was performed of the 7 x 8 mm irregular hypoechoic mass at the 1 o'clock position of the right breast 12 cm from the nipple.  At the conclusion of the procedure, a tissue marker clip was deployed into the biopsy cavity.  Follow- up 2-view mammogram was performed and dictated separately.  IMPRESSION: Ultrasound-guided biopsy of suspicious right breast mass.  No apparent complications.  Pathology will be followed.  Original Report Authenticated By: Rosendo Gros, M.D.   Mm Digital Diag Ltd R  12/28/2011  *RADIOLOGY REPORT*  Clinical Data:  Called back from screening mammography, possible right breast mass visualized only on the MLO view.  DIGITAL DIAGNOSTIC RIGHT MAMMOGRAM WITH CAD AND RIGHT BREAST ULTRASOUND:  Comparison:  12/08/2011, 12/18/2008, dating back to 11/03/2005.  Findings:  Laterally exaggerated CC view, MLO view, and spot compression MLO view of the right breast were obtained.  The  area of concern on screening mammography persists on follow-up imaging, and is consistent with a dense, spiculated mass in the posterior third of the upper breast measuring maximally 9 mm.  The mass is not identified on the laterally exaggerated CC view.  No associated microcalcifications. Mammographic images were processed with CAD.  On physical exam, I palpate a vague mass in the medial portion of the superior most right breast.  Ultrasound is performed, showing a vertically oriented irregular hypoechoic mass in the 1 o'clock position of the right breast, approximately 12 cm from the nipple, with its posterior margin closely opposed to the pectoralis muscle.  The mass measures approximately 1.0 x 0.7 x 0.4 cm, and demonstrates slight acoustic shadowing.  IMPRESSION: Suspicious mass in the 1 o'clock position of the right breast, approximate 12 cm from the nipple, with a posterior margin that closely abuts the right pectoralis muscle.  Recommendation:  Ultrasound guided core needle biopsy is recommended.  This was discussed with the patient who agrees to proceed.  This has been scheduled for May 6 at 60  o'clock a.m.  BI-RADS CATEGORY 4:  Suspicious abnormality - biopsy should be considered.  Original Report Authenticated By: Arnell Sieving, M.D.   Mm Digital Diagnostic Unilat R  01/02/2012  *RADIOLOGY REPORT*  Clinical Data:  Evaluate clip placement following ultrasound guided right breast biopsy.  DIGITAL DIAGNOSTIC RIGHT MAMMOGRAM  Comparison:  12/28/2011  Findings:  Films are performed following ultrasound guided biopsy of 7 x 8 mm mass at the 1 o'clock position of the right breast 12 cm from the nipple.  The biopsy clip is in satisfactory position.  IMPRESSION: Satisfactory clip position following ultrasound guided right breast biopsy.  Pathology will be followed.  Original Report Authenticated By: Rosendo Gros, M.D.   Mm Radiologist Eval And Mgmt  01/03/2012  *RADIOLOGY REPORT*  ESTABLISHED PATIENT  OFFICE VISIT - LEVEL II [phone])  Chief Complaint:  The patient returns for discussion of the pathologic findings after ultrasound-guided core needle biopsy of a mass in the 1 o'clock position of the right breast 12 cm from the right nipple.  History:  A suspicious 10 mm mass was identified at 1 o'clock 12 cm in the right nipple following screening mammography, diagnostic mammography and right breast ultrasound.  Biopsy was recommended and performed on 01/02/2012.  Exam:  On physical examination, the biopsy site in the upper portion of the right breast is clean and dry with no sign of hematoma or infection.  Assessment and Plan:  Histologic evaluation demonstrates grade II invasive ductal carcinoma.  This is concordant with the imaging findings.  Results were discussed with the patient and a family member.  She reports no complications from the procedure.  The patient is scheduled to be seen in the [facility] on 01/11/2012.  Breast MRI is scheduled for 01/07/2012. Questions were answered.  Informational materials were given.  Original Report Authenticated By: Daryl Eastern, M.D.     LABS:    Chemistry      Component Value Date/Time   NA 142 01/11/2012 0805   K 3.7 01/11/2012 0805   CL 103 01/11/2012 0805   CO2 30 01/11/2012 0805   BUN 16 01/11/2012 0805   CREATININE 0.87 01/11/2012 0805      Component Value Date/Time   CALCIUM 9.7 01/11/2012 0805   ALKPHOS 86 01/11/2012 0805   AST 20 01/11/2012 0805   ALT 23 01/11/2012 0805   BILITOT 0.7 01/11/2012 0805      Lab Results  Component Value Date   WBC 4.4 01/11/2012   HGB 14.3 01/11/2012   HCT 42.1 01/11/2012   MCV 94.0 01/11/2012   PLT 250 01/11/2012       PATHOLOGY:  ADDITIONAL INFORMATION: PROGNOSTIC INDICATORS - ACIS Results IMMUNOHISTOCHEMICAL AND MORPHOMETRIC ANALYSIS BY THE AUTOMATED CELLULAR IMAGING SYSTEM (ACIS) Estrogen Receptor (Negative, <1%): 100%,POSITIVE, STRONG STAINING  INTENSITY Progesterone Receptor (Negative, <1%): 16%, POSITIVE, STRONG STAINING INTENSITY Proliferation Marker Ki67 by M IB-1 (Low<20%): 19% All controls stained appropriately Abigail Miyamoto MD Pathologist, Electronic Signature ( Signed 01/06/2012) CHROMOGENIC IN-SITU HYBRIDIZATION Interpretation HER-2/NEU BY CISH - NO AMPLIFICATION OF HER-2 DETECTED. THE RATIO OF HER-2: CEP 17 SIGNALS WAS 1.73. Reference range: Ratio: HER2:CEP17 < 1.8 - gene amplification not observed Ratio: HER2:CEP 17 1.8-2.2 - equivocal result Ratio: HER2:CEP17 > 2.2 - gene amplification observed Comment: Forty tumor cells were analyzed. Abigail Miyamoto MD Pathologist, Electronic Signature ( Signed 01/05/2012) 1 of 2 FINAL for Cindy, Skinner ([id number]) FINAL DIAGNOSIS Diagnosis Breast, right, needle core biopsy, 1 o'clock, 12  cm from nipple - INVASIVE DUCTAL CARCINOMA. SEE COMMENT. Microscopic Comment Although the grade of tumor is best assessed at resection, with these biopsies, the invasive tumor is grade II. Breast prognostic studies are pending and will be reported in an addendum. (CR:kh 01-03-12) [place] RUND DO Pathologist, Electronic Signature (Case signed 01/03/2012) Specimen Gross and Clinical In  ASSESSMENT    61 year old female with early stage invasive ductal carcinoma that was grade 2 ER positive PR positive HER-2/neu negative measuring 1.1 cm by MRI. Patient has a very favorable disease. She was seen in the multidisciplinary breast clinic today for discussion of treatment options. We discussed in detail patient's pathology and the rationale for the recommendations that have been made. Patient is a excellent candidate for breast conservation and therefore patient has been recommended a lumpectomy with sentinel lymph node biopsy. After that she will receive breast radiation therapy. Following a cesium guidelines I have recommended adjuvant antiestrogen therapy with utilization of an aromatase  inhibitor in this postmenopausal patient. Risks and benefits of aromatase inhibitors were discussed with the patient as well as the rationale.    PLAN:    #1 patient will proceed with her lumpectomy and sentinel node biopsy and next few weeks. This will be performed by Dr. Ovidio Kin.  #2 she will then be seen back by me for discussion of her final pathology. If the pathology remains as we understand it now she will then be referred to Dr. Lurline Hare for radiation therapy.  #3 once patient with completes her radiation therapy then I will plan on starting her on antiestrogen therapy adjuvantly following NCCN  guidelines for early stage breast cancer. Risks and benefits and plan were discussed with the patient.  #4 patient will be seen back in my office in about 4-5 weeks time.       Thank you so much for allowing me to participate in the care of Cindy Skinner. I will continue to follow up the patient with you and assist in her care.  All questions were answered. The patient knows to call the clinic with any problems, questions or concerns. We can certainly see the patient much sooner if necessary.  I spent 55 minutes counseling the patient face to face. The total time spent in the appointment was 55 minutes. Drue Second, MD Medical/Oncology [facility] [phone] (beeper) [phone] (Office)  01/11/2012, 12:45 PM  01/11/2012, 10:04 AM

## 2012-01-12 ENCOUNTER — Other Ambulatory Visit: Payer: BC Managed Care – PPO | Admitting: Lab

## 2012-01-12 ENCOUNTER — Ambulatory Visit (HOSPITAL_BASED_OUTPATIENT_CLINIC_OR_DEPARTMENT_OTHER): Payer: BC Managed Care – PPO | Admitting: Genetic Counselor

## 2012-01-12 DIAGNOSIS — C50319 Malignant neoplasm of lower-inner quadrant of unspecified female breast: Secondary | ICD-10-CM

## 2012-01-12 NOTE — Progress Notes (Signed)
Dr.  Welton Flakes requested a consultation for genetic counseling and risk assessment for Cindy Skinner, a 61 y.o. female, for discussion of her personal history of breast cancer and family history of ovarian canc34. She presents to clinic today to discuss the possibility of a genetic predisposition to cancer, and to further clarify her risks, as well as her family members' risks for cancer. She has deferred testing at this time.  HISTORY OF PRESENT ILLNESS: In April 2013, at the age of 64, Cindy Skinner was diagnosed with invasive ductal carcinoma of the right breast. This will be treated with surgery on Jan 20, 2012.    Past Medical History  Diagnosis Date  . H/O colonoscopy   . H/O bone density study 10/2011  . Contact lens/glasses fitting   . Night sweats   . Wears dentures   . Sinus complaint   . Pain     hx of back and joint pain  . Arthritis   . Hot flashes     Past Surgical History  Procedure Date  . Appendectomy   . Neck surgery   . Breast surgery     History  Substance Use Topics  . Smoking status: Not on file  . Smokeless tobacco: Current User    Last Attempt to Quit: 10/28/2011  . Alcohol Use:     REPRODUCTIVE HISTORY AND PERSONAL RISK ASSESSMENT FACTORS: Menarche was at age 24s - she is unsure why she started her periods so late.   Post Menopause Uterus Intact: Yes Ovaries Intact: Yes [id number] , first live birth at age N/A  She ha not previously undergone treatment for infertility.   She has never been on OCPs   She has not used HRT in the past.    FAMILY HISTORY:  We obtained a detailed, 4-generation family history.  Significant diagnoses are listed below: Family History  Problem Relation Age of Onset  . Ovarian cancer Sister   . Uterine cancer Sister   . Lung cancer Maternal Aunt   The patient was diagnosed with breast cancer at age 65.  She has two full sisters, one full brother and one maternal half sister.  A full sister was diagnosed with ovarian  cancer at age 39, and cervical cancer at age 35.  The maternal half sister was diagnosed ovarian cancer in her 30s.  A maternal aunt was diagnosed with throat cancer and then lung cancer later in life.  She was a smoker.  There is no other reported cancer history on either side of the family.  Patient's maternal ancestors are of [place] and [place] descent, and paternal ancestors are of unknown descent. There is no reported Ashkenazi Jewish ancestry. There is no  known consanguinity.  GENETIC COUNSELING RISK ASSESSMENT, DISCUSSION, AND SUGGESTED FOLLOW UP: We reviewed the natural history and genetic etiology of sporadic, familial and hereditary cancer syndromes. About 5-10% of breast cancer is hereditary, and of this about 85% is the result of BRCA1 and BRCA2 mutations.  We discussed the red flags of hereditary cancer syndromes and the dominant inheritance patterns.    The patient's personal and family history is suggestive of the following possible diagnosis: hereditary cancer syndrome  We discussed that identification of a hereditary cancer syndrome may help her care providers tailor the patients medical management. If a mutation indicating a hereditary cancer syndrome is detected in this case, the [employer] recommendations would include increased cancer surveillance and possible prophylactic surgery. If a mutation  is detected, the patient will be referred back to the referring provider and to any additional appropriate care providers to discuss the relevant options.   If a mutation is not found in the patient, this will decrease the likelihood of hereditary cancer syndrome as the explanation for her breast cancer. Cancer surveillance options would be discussed for the patient according to the appropriate standard National Comprehensive Cancer Network and American Cancer Society guidelines, with consideration of their personal and family history risk factors. In  this case, the patient will be referred back to their care providers for discussions of management.   In order to estimate her chance of having a BRCA1 or BRCA2 mutation, we used statistical models (Penn II) and laboratory data that take into account her personal medical history, family history and ancestry.  Because each model is different, there can be a lot of variability in the risks they give.  Therefore, these numbers must be considered a rough range and not a precise risk of having a BRCA1 and BRCA2 mutation.  These models estimate that she has approximately a 12% chance of having a mutation. Based on this assessment of her family and personal history, genetic testing is recommended.  After considering the risks, benefits, and limitations, the patient deferred testing at this time.   The patient was seen for a total of 30 minutes, greater than 50% of which was spent face-to-face counseling.  This plan is being carried out per Dr. Milta Deiters recommendations.  This note will also be sent to the referring provider via the electronic medical record. The patient will be supplied with a summary of this genetic counseling discussion as well as educational information on the discussed hereditary cancer syndromes following the conclusion of their visit.   Patient was discussed with Dr. Drue Second.  EDUCATIONAL INFORMATION SUPPLIED TO PATIENT AT ENCOUNTER:  HEreditary breast and ovarian cancer syndrome   _______________________________________________________________________ For Office Staff:  Number of people involved in session: 3 Was an Intern/ student involved with case: yes

## 2012-01-16 ENCOUNTER — Telehealth: Payer: Self-pay | Admitting: *Deleted

## 2012-01-16 ENCOUNTER — Encounter: Payer: Self-pay | Admitting: *Deleted

## 2012-01-16 NOTE — Telephone Encounter (Signed)
Spoke to pt concerning [facility] from 5/15.  Pt denies questions or concerns regarding dx or treatment care plan.  Confirmed surgery date on 01/20/12 and f/u appt with Dr. Welton Flakes.  Encourage pt to call with needs.  Received verbal understanding.  Contact information given.

## 2012-01-16 NOTE — Telephone Encounter (Signed)
Left vm for pt to return call regarding [facility] from 5/15 

## 2012-01-17 ENCOUNTER — Encounter: Payer: Self-pay | Admitting: Genetic Counselor

## 2012-01-17 ENCOUNTER — Telehealth: Payer: Self-pay | Admitting: Oncology

## 2012-01-17 ENCOUNTER — Encounter (HOSPITAL_BASED_OUTPATIENT_CLINIC_OR_DEPARTMENT_OTHER): Payer: Self-pay | Admitting: *Deleted

## 2012-01-17 NOTE — Pre-Procedure Instructions (Signed)
To go to [facility] for CXR 

## 2012-01-17 NOTE — Telephone Encounter (Signed)
lmonvm adviisng the pt of her June 10 th appt that has been moved up from 02/22/2012

## 2012-01-18 ENCOUNTER — Ambulatory Visit
Admission: RE | Admit: 2012-01-18 | Discharge: 2012-01-18 | Disposition: A | Discharge status: Home / Self Care | Payer: BC Managed Care – PPO | Source: Ambulatory Visit | Attending: Surgery | Admitting: Surgery

## 2012-01-18 NOTE — Progress Notes (Signed)
Dr. Ivin Booty shown CXR results- OK  For surgery

## 2012-01-20 ENCOUNTER — Encounter: Payer: Self-pay | Admitting: *Deleted

## 2012-01-20 ENCOUNTER — Ambulatory Visit (HOSPITAL_COMMUNITY)
Admission: RE | Admit: 2012-01-20 | Discharge: 2012-01-20 | Disposition: A | Discharge status: Home / Self Care | Payer: BC Managed Care – PPO | Source: Ambulatory Visit | Attending: Surgery | Admitting: Surgery

## 2012-01-20 ENCOUNTER — Encounter (HOSPITAL_BASED_OUTPATIENT_CLINIC_OR_DEPARTMENT_OTHER): Payer: Self-pay | Admitting: Anesthesiology

## 2012-01-20 ENCOUNTER — Ambulatory Visit (HOSPITAL_BASED_OUTPATIENT_CLINIC_OR_DEPARTMENT_OTHER)
Admission: RE | Admit: 2012-01-20 | Discharge: 2012-01-20 | Disposition: A | Discharge status: Home / Self Care | Payer: BC Managed Care – PPO | Source: Ambulatory Visit | Attending: Surgery | Admitting: Surgery

## 2012-01-20 ENCOUNTER — Ambulatory Visit (HOSPITAL_BASED_OUTPATIENT_CLINIC_OR_DEPARTMENT_OTHER): Payer: BC Managed Care – PPO | Admitting: Anesthesiology

## 2012-01-20 ENCOUNTER — Ambulatory Visit
Admission: RE | Admit: 2012-01-20 | Discharge: 2012-01-20 | Disposition: A | Discharge status: Home / Self Care | Payer: BC Managed Care – PPO | Source: Ambulatory Visit | Attending: Surgery | Admitting: Surgery

## 2012-01-20 ENCOUNTER — Encounter (HOSPITAL_BASED_OUTPATIENT_CLINIC_OR_DEPARTMENT_OTHER)
Admission: RE | Disposition: A | Discharge status: Home / Self Care | Payer: Self-pay | Source: Ambulatory Visit | Attending: Surgery

## 2012-01-20 ENCOUNTER — Encounter (HOSPITAL_BASED_OUTPATIENT_CLINIC_OR_DEPARTMENT_OTHER): Payer: Self-pay | Admitting: *Deleted

## 2012-01-20 ENCOUNTER — Other Ambulatory Visit (HOSPITAL_COMMUNITY): Payer: Self-pay | Admitting: Surgery

## 2012-01-20 DIAGNOSIS — C50919 Malignant neoplasm of unspecified site of unspecified female breast: Secondary | ICD-10-CM

## 2012-01-20 DIAGNOSIS — F17200 Nicotine dependence, unspecified, uncomplicated: Secondary | ICD-10-CM | POA: Insufficient documentation

## 2012-01-20 DIAGNOSIS — C50419 Malignant neoplasm of upper-outer quadrant of unspecified female breast: Secondary | ICD-10-CM | POA: Insufficient documentation

## 2012-01-20 DIAGNOSIS — C50319 Malignant neoplasm of lower-inner quadrant of unspecified female breast: Secondary | ICD-10-CM

## 2012-01-20 HISTORY — PX: MASTECTOMY PARTIAL / LUMPECTOMY W/ AXILLARY LYMPHADENECTOMY: SUR852

## 2012-01-20 SURGERY — BREAST LUMPECTOMY WITH NEEDLE LOCALIZATION AND AXILLARY SENTINEL LYMPH NODE BX
Anesthesia: General | Site: Breast | Laterality: Right | Wound class: Clean

## 2012-01-20 MED ORDER — TECHNETIUM TC 99M SULFUR COLLOID FILTERED
1.0000 | Freq: Once | INTRAVENOUS | Status: AC | PRN
  Administered 2012-01-20: 1 via INTRADERMAL

## 2012-01-20 MED ORDER — HYDROMORPHONE HCL PF 1 MG/ML IJ SOLN
0.2500 mg | INTRAMUSCULAR | Status: DC | PRN
  Administered 2012-01-20 (×2): 0.25 mg via INTRAVENOUS

## 2012-01-20 MED ORDER — CHLORHEXIDINE GLUCONATE 4 % EX LIQD: 1.0000 "application " | Freq: Once | CUTANEOUS | Status: DC

## 2012-01-20 MED ORDER — ONDANSETRON HCL 4 MG/2ML IJ SOLN
INTRAMUSCULAR | Status: DC | PRN
  Administered 2012-01-20: 4 mg via INTRAVENOUS

## 2012-01-20 MED ORDER — FENTANYL CITRATE 0.05 MG/ML IJ SOLN
50.0000 ug | INTRAMUSCULAR | Status: DC | PRN
  Administered 2012-01-20: 50 ug via INTRAVENOUS

## 2012-01-20 MED ORDER — KETOROLAC TROMETHAMINE 30 MG/ML IJ SOLN
INTRAMUSCULAR | Status: DC | PRN
  Administered 2012-01-20: 30 mg via INTRAVENOUS

## 2012-01-20 MED ORDER — DROPERIDOL 2.5 MG/ML IJ SOLN
INTRAMUSCULAR | Status: DC | PRN
  Administered 2012-01-20: 0.625 mg via INTRAVENOUS

## 2012-01-20 MED ORDER — LIDOCAINE HCL (CARDIAC) 20 MG/ML IV SOLN
INTRAVENOUS | Status: DC | PRN
  Administered 2012-01-20: 100 mg via INTRAVENOUS

## 2012-01-20 MED ORDER — CEFAZOLIN SODIUM 1-5 GM-% IV SOLN
1.0000 g | INTRAVENOUS | Status: AC
  Administered 2012-01-20: 1 g via INTRAVENOUS

## 2012-01-20 MED ORDER — MIDAZOLAM HCL 2 MG/2ML IJ SOLN
0.5000 mg | INTRAMUSCULAR | Status: DC | PRN
  Administered 2012-01-20: 1 mg via INTRAVENOUS

## 2012-01-20 MED ORDER — METOCLOPRAMIDE HCL 5 MG/ML IJ SOLN: 10.0000 mg | Freq: Once | INTRAMUSCULAR | Status: DC | PRN

## 2012-01-20 MED ORDER — DEXAMETHASONE SODIUM PHOSPHATE 4 MG/ML IJ SOLN
INTRAMUSCULAR | Status: DC | PRN
  Administered 2012-01-20: 10 mg via INTRAVENOUS

## 2012-01-20 MED ORDER — PROPOFOL 10 MG/ML IV EMUL
INTRAVENOUS | Status: DC | PRN
  Administered 2012-01-20: 180 mg via INTRAVENOUS

## 2012-01-20 MED ORDER — TRAMADOL HCL 50 MG PO TABS: 50.0000 mg | ORAL_TABLET | Freq: Four times a day (QID) | ORAL | Status: AC | PRN

## 2012-01-20 MED ORDER — SODIUM CHLORIDE 0.9 % IJ SOLN
INTRAMUSCULAR | Status: DC | PRN
  Administered 2012-01-20: 12:00:00 via INTRAMUSCULAR

## 2012-01-20 MED ORDER — MIDAZOLAM HCL 5 MG/5ML IJ SOLN
INTRAMUSCULAR | Status: DC | PRN
  Administered 2012-01-20: 2 mg via INTRAVENOUS

## 2012-01-20 MED ORDER — FENTANYL CITRATE 0.05 MG/ML IJ SOLN
INTRAMUSCULAR | Status: DC | PRN
  Administered 2012-01-20: 25 ug via INTRAVENOUS
  Administered 2012-01-20: 50 ug via INTRAVENOUS
  Administered 2012-01-20: 25 ug via INTRAVENOUS

## 2012-01-20 MED ORDER — LACTATED RINGERS IV SOLN
INTRAVENOUS | Status: DC
  Administered 2012-01-20 (×2): via INTRAVENOUS

## 2012-01-20 MED ORDER — ACETAMINOPHEN 10 MG/ML IV SOLN
1000.0000 mg | Freq: Once | INTRAVENOUS | Status: AC
  Administered 2012-01-20: 1000 mg via INTRAVENOUS

## 2012-01-20 MED ORDER — BUPIVACAINE HCL (PF) 0.25 % IJ SOLN
INTRAMUSCULAR | Status: DC | PRN
  Administered 2012-01-20: 30 mL

## 2012-01-20 SURGICAL SUPPLY — 60 items
ADH SKN CLS APL DERMABOND .7 (GAUZE/BANDAGES/DRESSINGS) ×2
APPLIER CLIP 11 MED OPEN (CLIP)
APR CLP MED 11 20 MLT OPN (CLIP)
BANDAGE ELASTIC 6 VELCRO ST LF (GAUZE/BANDAGES/DRESSINGS) ×1 IMPLANT
BINDER BREAST LRG (GAUZE/BANDAGES/DRESSINGS) ×1 IMPLANT
BINDER BREAST MEDIUM (GAUZE/BANDAGES/DRESSINGS) IMPLANT
BINDER BREAST XLRG (GAUZE/BANDAGES/DRESSINGS) ×1 IMPLANT
BINDER BREAST XXLRG (GAUZE/BANDAGES/DRESSINGS) IMPLANT
BLADE SURG 10 STRL SS (BLADE) ×2 IMPLANT
BLADE SURG 15 STRL LF DISP TIS (BLADE) ×1 IMPLANT
BLADE SURG 15 STRL SS (BLADE) ×2
CANISTER SUCTION 1200CC (MISCELLANEOUS) ×2 IMPLANT
CHLORAPREP W/TINT 26ML (MISCELLANEOUS) ×2 IMPLANT
CLIP APPLIE 11 MED OPEN (CLIP) IMPLANT
CLIP TI WIDE RED SMALL 6 (CLIP) ×1 IMPLANT
CLOTH BEACON ORANGE TIMEOUT ST (SAFETY) ×2 IMPLANT
COVER MAYO STAND STRL (DRAPES) ×2 IMPLANT
COVER PROBE W GEL 5X96 (DRAPES) ×2 IMPLANT
COVER TABLE BACK 60X90 (DRAPES) ×2 IMPLANT
DECANTER SPIKE VIAL GLASS SM (MISCELLANEOUS) IMPLANT
DERMABOND ADVANCED (GAUZE/BANDAGES/DRESSINGS) ×2
DERMABOND ADVANCED .7 DNX12 (GAUZE/BANDAGES/DRESSINGS) ×1 IMPLANT
DEVICE DUBIN W/COMP PLATE 8390 (MISCELLANEOUS) ×2 IMPLANT
DRAIN CHANNEL 19F RND (DRAIN) IMPLANT
DRAIN HEMOVAC 1/8 X 5 (WOUND CARE) IMPLANT
DRAPE LAPAROSCOPIC ABDOMINAL (DRAPES) ×2 IMPLANT
DRAPE UTILITY XL STRL (DRAPES) ×2 IMPLANT
ELECT COATED BLADE 2.86 ST (ELECTRODE) ×2 IMPLANT
ELECT REM PT RETURN 9FT ADLT (ELECTROSURGICAL) ×2
ELECTRODE REM PT RTRN 9FT ADLT (ELECTROSURGICAL) ×1 IMPLANT
EVACUATOR SILICONE 100CC (DRAIN) IMPLANT
GAUZE SPONGE 4X4 12PLY STRL LF (GAUZE/BANDAGES/DRESSINGS) ×1 IMPLANT
GAUZE SPONGE 4X4 16PLY XRAY LF (GAUZE/BANDAGES/DRESSINGS) IMPLANT
GLOVE SURG SIGNA 7.5 PF LTX (GLOVE) ×6 IMPLANT
GOWN PREVENTION PLUS XLARGE (GOWN DISPOSABLE) ×1 IMPLANT
GOWN PREVENTION PLUS XXLARGE (GOWN DISPOSABLE) ×1 IMPLANT
KIT MARKER MARGIN INK (KITS) ×1 IMPLANT
NDL SAFETY ECLIPSE 18X1.5 (NEEDLE) ×1 IMPLANT
NEEDLE HYPO 18GX1.5 SHARP (NEEDLE) ×2
NEEDLE HYPO 25X1 1.5 SAFETY (NEEDLE) ×4 IMPLANT
NS IRRIG 1000ML POUR BTL (IV SOLUTION) ×2 IMPLANT
PACK BASIN DAY SURGERY FS (CUSTOM PROCEDURE TRAY) ×2 IMPLANT
PAD ALCOHOL SWAB (MISCELLANEOUS) ×2 IMPLANT
PENCIL BUTTON HOLSTER BLD 10FT (ELECTRODE) ×2 IMPLANT
PIN SAFETY STERILE (MISCELLANEOUS) IMPLANT
SHEET MEDIUM DRAPE 40X70 STRL (DRAPES) ×2 IMPLANT
SLEEVE SCD COMPRESS KNEE MED (MISCELLANEOUS) ×2 IMPLANT
SPONGE GAUZE 4X4 12PLY (GAUZE/BANDAGES/DRESSINGS) IMPLANT
SPONGE LAP 18X18 X RAY DECT (DISPOSABLE) ×2 IMPLANT
SUT ETHILON 2 0 FS 18 (SUTURE) IMPLANT
SUT MON AB 5-0 PS2 18 (SUTURE) ×2 IMPLANT
SUT SILK 3 0 TIES 17X18 (SUTURE)
SUT SILK 3-0 18XBRD TIE BLK (SUTURE) IMPLANT
SUT VICRYL 3-0 CR8 SH (SUTURE) ×2 IMPLANT
SYR CONTROL 10ML LL (SYRINGE) ×4 IMPLANT
TOWEL OR 17X24 6PK STRL BLUE (TOWEL DISPOSABLE) ×2 IMPLANT
TOWEL OR NON WOVEN STRL DISP B (DISPOSABLE) ×2 IMPLANT
TUBE CONNECTING 20X1/4 (TUBING) ×2 IMPLANT
WATER STERILE IRR 1000ML POUR (IV SOLUTION) ×1 IMPLANT
YANKAUER SUCT BULB TIP NO VENT (SUCTIONS) ×2 IMPLANT

## 2012-01-20 NOTE — Anesthesia Procedure Notes (Signed)
Procedure Name: LMA Insertion Date/Time: 01/20/2012 11:26 AM Performed by: Gar Gibbon Pre-anesthesia Checklist: Patient identified, Emergency Drugs available, Suction available and Patient being monitored Patient Re-evaluated:Patient Re-evaluated prior to inductionOxygen Delivery Method: Circle System Utilized Preoxygenation: Pre-oxygenation with 100% oxygen Intubation Type: IV induction Ventilation: Mask ventilation without difficulty LMA: LMA inserted LMA Size: 4.0 Number of attempts: 1 Airway Equipment and Method: bite block Placement Confirmation: positive ETCO2 Tube secured with: Tape Dental Injury: Teeth and Oropharynx as per pre-operative assessment

## 2012-01-20 NOTE — H&P (View-Only) (Signed)
 Re:   Cindy Skinner DOB:   03/03/1951 MRN:   [id number]  [facility]  ASSESSMENT AND PLAN: 1.  Right breast cancer, T1c, N0.  IDC, MRI - 1.1 cm tumor  ER - 100, PR - 16, Ki67 - 19%, Her2 Neu - neg  Drs Khan and Wentworth consulting oncology.  I discussed the options for breast cancer treatment with the patient.  I discussed the idea of a multidisciplinary approach to the treatment of breast cancer (she's in the [facility]), which often includes medical oncology and radiation oncology.  I discussed the surgical options of lumpectomy vs. mastectomy.  I discussed the options of lymph node biopsy.  The treatment plan depends on the pathologic staging of the tumor and the patient's personal wishes.  The risks of surgery include, but are not limited to, bleeding, infection, the need for further surgery, and nerve injury.  She is a candidate for right breast lumpectomy and right axillary SLNBx.  Plan:  Right breast needle loc lumpectomy and right axillary SLNBx.  Followed by rad tx and probable anti-estrogen therapy.  2.  Trying to quit smoking. 3.  Because of a sister with possible ovarian cancer, she is going for a genetics consultation.  REFERRING PHYSICIAN: Patricia Grubb, NP  HISTORY OF PRESENT ILLNESS: Cindy Skinner is a 60 y.o. (DOB: 10/29/1950)  "country" white female whose primary care physician is Patrice Grubb, NP, [facility], and comes the [facility] clinic for right breast cancer.  She had a right breast biopsy in her 20's for benign disease.  She had her last mammogram about 2 years ago.  She felt nothing new in her breast.  She had a mammogram on 12/28/2011 which showed a nodule deep in her right breast at the 1 o'clock position.  She underwent a right breast core biopsy on 01/02/2012 which showed an invasive ductal carcinoma.  Her MRI on 01/10/2012 showed a 1.1 x 1.0 cm mass at the 12 o'clock area of the right breast.   She has no family history of breast cancer.  She does have a  sister who had "ovarian cancer" when she was 60 yo.  She is not married.  She has no children.  She is accompanied by Marsha Mylan, whom she lives with.  She is not on hormones.  She is trying to quit smoking.   No past medical history on file.   No past surgical history on file.    No current outpatient prescriptions on file.   No current facility-administered medications for this visit.   Facility-Administered Medications Ordered in Other Visits  Medication Dose Route Frequency Provider Last Rate Last Dose  . gadobenate dimeglumine (MULTIHANCE) injection 14 mL  14 mL Intravenous Once PRN Medication Radiologist, MD         No Known Allergies  REVIEW OF SYSTEMS: Skin:  No history of rash.  No history of abnormal moles. Infection:  No history of hepatitis or HIV.  No history of MRSA. Neurologic:  No history of stroke.  No history of seizure.  No history of headaches. Cardiac:  No history of hypertension. No history of heart disease.  No history of prior cardiac catheterization.  No history of seeing a cardiologist. Pulmonary:  Smokes and knows it is bad for her health.  She is trying to quit.  She is on Wellbutrin.  Endocrine:  No diabetes. No thyroid disease. Gastrointestinal:  No history of stomach disease.  No history of liver disease.  No history of   gall bladder disease.  No history of pancreas disease.  No history of colon disease. Had an appendectomy in [place] in 1995 - but actually had a rupture ovarian cyst. Urologic:  No history of kidney stones.  No history of bladder infections. Musculoskeletal:  Cervical spine surgery by Dr. Cram - 2007.  Has done well with this. Hematologic:  No bleeding disorder.  No history of anemia.  Not anticoagulated. Psycho-social:  The patient is oriented.   The patient has no obvious psychologic or social impairment to understanding our conversation and plan.  SOCIAL and FAMILY HISTORY: Works in maintenance.  She is not married nor has any  children. Friend, Marsha Mylan is with the patient.  PHYSICAL EXAM: There were no vitals taken for this visit.  General: WN WF who is alert and generally healthy appearing.  HEENT: Normal. Pupils equal. Good dentition. Neck: Supple. No mass.  No thyroid mass. Lymph Nodes:  No supraclavicular, cervical nodes or axillary nodes. Breasts:  Right:  Bruise at 12 o'clock.  Some tenderness.  I do not feel a mass.  Left:  No mass or area of concern. Lungs: Clear to auscultation and symmetric breath sounds. Heart:  RRR. No murmur or rub. Abdomen: Soft. No mass. No tenderness. No hernia. Normal bowel sounds.  No abdominal scars. Rectal: Not done. Extremities:  Good strength and ROM  in upper and lower extremities. Neurologic:  Grossly intact to motor and sensory function. Psychiatric: Has normal mood and affect. Behavior is normal.   DATA REVIEWED: MRI, mammogram, path report.  Copies of path report given to patient.  Draden Cottingham, MD,  FACS [facility] [address]  [address]   [place], [place]    [address] Phone:  [phone] FAX:  [phone]  

## 2012-01-20 NOTE — Brief Op Note (Addendum)
01/20/2012  12:47 PM  PATIENT:  Cindy Skinner, 61 y.o., female, MRN: [id number]  PREOP DIAGNOSIS:  right breast cancer  POSTOP DIAGNOSIS:   Right breast cancer, 1 o'clock (T1, N0)  PROCEDURE:   Procedure(s): Right BREAST LUMPECTOMY WITH NEEDLE LOCALIZATION, injection of right breast with methylene blue 1.5 cc (40%), AND Right AXILLARY SENTINEL LYMPH NODE BX  (counts 3300, background 200, blue node)  SURGEON:   Ovidio Kin, M.D.  ASSISTANT:   none  ANESTHESIA:   general  Hart Robinsons, MD - Anesthesiologist Gar Gibbon, CRNA - CRNA  General  EBL:  < 50  ml  BLOOD ADMINISTERED: none  DRAINS: none   LOCAL MEDICATIONS USED:   30 cc 1/4% marcaine  SPECIMEN:   Right breast biopsy, right axillary node, superior margin right lumpecotmy  COUNTS CORRECT:  YES  INDICATIONS FOR PROCEDURE:  Cindy Skinner is a 61 y.o. (DOB: November 18, 1950) white female whose primary care physician is Lenor Coffin, NP, [facility],  and comes for right breast lumpectomy and right axillary SLNBx.   The indications and risks of the surgery were explained to the patient.  The risks include, but are not limited to, infection, bleeding, and nerve injury.  Note dictated to:   [phone]

## 2012-01-20 NOTE — Progress Notes (Signed)
Mailed after appt letter to pt. 

## 2012-01-20 NOTE — Op Note (Addendum)
NAME:  Cindy Skinner, Cindy Skinner              ACCOUNT NO.:  [id number]  MEDICAL RECORD NO.:  [id number]  LOCATION:                                 FACILITY:  PHYSICIAN:  Sandria Bales. Ezzard Standing, M.D.  DATE OF BIRTH:  [birth year]  DATE OF PROCEDURE:  01/20/2012                              OPERATIVE REPORT  PREOPERATIVE DIAGNOSIS:  Right breast cancer, 1 o'clock position.  POSTOPERATIVE DIAGNOSIS:  Right breast cancer, 1 o'clock position (T1, N0).  PROCEDURE:  Right breast lumpectomy with wire localization, injection of methylene blue in the right breast, and right axillary sentinel lymph node biopsy.  SURGEON:  Sandria Bales. Ezzard Standing, MD.  ASSISTANT:  No assistant.  ANESTHESIA:  General endotracheal.  ESTIMATED BLOOD LOSS:  Minimal.  INDICATIONS FOR PROCEDURE:  Cindy Skinner is a 61 year old white female, who is a patient of Ria Comment, who presented to the [facility] with a biopsy-proven right breast cancer.  By MRI, this appears to be about 1.1 cm, and discussion carried out with the patient about the further surgical treatment of this.  I discussed with her about proceeding with lumpectomy and right axillary sentinel lymph node.  I discussed the indications and potential complications.  Potential complications include, but are not limited to, bleeding, infection, the need for further surgery, and nerve injury.  DESCRIPTION OF PROCEDURE:  The patient placed in a supine position in room #8 at the [facility].  She had [facility] where Dr. Beckie Salts placed a wire in her right breast at about the 12 o'clock position.  She was injected with 1 millicuries of technetium sulfur colloid in the preoperative area.  A time-out was held and the surgical checklist run.    Her right breast was prepped with ChloraPrep and sterilely draped.  I injected her right periareolar area without a mL of 40% methylene blue.  I first went to the axilla.  In the mid axilla, I  found a hot area and made an incision and cut down to find a hot blue node.  The node had counts of 3300, the background was about 200, and this was sent to pathology.  The node looked grossly benign.  The wound was then irrigated.    I then made an elliptical incision in the breast, approximately 5 cm in length, excising a block of tissue around the wire.  I tried to get out about 5-6 cm.  I went down to the chest wall and took out a block specimen about 5 x 6 cm.  I then painted the specimen with the six-color paint set.  A specimen mammogram was done with Dr. Azucena Kuba confirmed that the wire and the clip were in the middle of specimen and this was sent to pathology.  Because of the specimens are oriented a little to the superior aspect, I did take a superior margin that also painted with a superior and inferior paint for orientation purposes.  Both wounds were irrigated.  I infiltrated about 30 mL of 0.25% Marcaine as a local anesthetic.  I placed clips at 12 o'clock, 3 o'clock, 6 o'clock, 9 o'clock, and on the floor of the right breast  biopsy site.  I then closed the subcutaneous tissues with 3-0 Vicryl suture, closed the skin with a 5-0 Monocryl suture, painted the wound with Dermabond, and then sterilely dressed with 4x4s and a breast binder.  The patient tolerated the procedure well and she was transported to recovery room in good condition.  Sponge and needle count were correct at the end of the case.  I will see her back in about 10 days for wound check and followup.   Sandria Bales. Ezzard Standing, M.D., FACS   DHN/MEDQ  D:  01/20/2012  T:  01/20/2012  Job:  [id number]  cc:   Ria Comment, FNP  Donn Pierini editing is the funny box in Epic.  I don't know why it is placed there.  DN] Lew Dawes I signed the dictation, Epic moved it to the "hospital chart completion".  DN] [The dictation remains in  "In Box" even after signing it two times - so this is the third time.  DN 02/07/2012] [This is the dictation  that will not disappear.  DN 02/09/2012] [Now the note has appeared in red in Epic.  I have signed this note at least 4 previous times.  DN 02/11/2012]  Drue Second, M.D. Lurline Hare, M.D.

## 2012-01-20 NOTE — Transfer of Care (Signed)
Immediate Anesthesia Transfer of Care Note  Patient: Cindy Skinner  Procedure(s) Performed: Procedure(s) (LRB): BREAST LUMPECTOMY WITH NEEDLE LOCALIZATION AND AXILLARY SENTINEL LYMPH NODE BX (Right)  Patient Location: PACU  Anesthesia Type: General  Level of Consciousness: sedated  Airway & Oxygen Therapy: Patient Spontanous Breathing and Patient connected to face mask oxygen  Post-op Assessment: Report given to PACU RN and Post -op Vital signs reviewed and stable  Post vital signs: Reviewed and stable  Complications: No apparent anesthesia complications

## 2012-01-20 NOTE — Progress Notes (Signed)
Emotional support during breast injections °

## 2012-01-20 NOTE — Anesthesia Preprocedure Evaluation (Signed)
Anesthesia Evaluation  Patient identified by MRN, date of birth, ID band Patient awake    Reviewed: Allergy & Precautions, H&P , NPO status , Patient's Chart, lab work & pertinent test results, reviewed documented beta blocker date and time   Airway Mallampati: II TM Distance: >3 FB Neck ROM: full    Dental   Pulmonary neg pulmonary ROS,          Cardiovascular negative cardio ROS      Neuro/Psych negative neurological ROS  negative psych ROS   GI/Hepatic negative GI ROS, Neg liver ROS,   Endo/Other  negative endocrine ROS  Renal/GU negative Renal ROS  negative genitourinary   Musculoskeletal   Abdominal   Peds  Hematology negative hematology ROS (+)   Anesthesia Other Findings See surgeon's H&P   Reproductive/Obstetrics negative OB ROS                           Anesthesia Physical Anesthesia Plan  ASA: II  Anesthesia Plan: General   Post-op Pain Management:    Induction: Intravenous  Airway Management Planned: LMA  Additional Equipment:   Intra-op Plan:   Post-operative Plan: Extubation in OR  Informed Consent: I have reviewed the patients History and Physical, chart, labs and discussed the procedure including the risks, benefits and alternatives for the proposed anesthesia with the patient or authorized representative who has indicated his/her understanding and acceptance.   Dental Advisory Given  Plan Discussed with: CRNA and Surgeon  Anesthesia Plan Comments:         Anesthesia Quick Evaluation  

## 2012-01-20 NOTE — Interval H&P Note (Signed)
History and Physical Interval Note:  01/20/2012 11:13 AM  Cindy Skinner  has presented today for surgery, with the diagnosis of right breast cancer  The various methods of treatment have been discussed with the patient and family. [name] and sister at bed side.  After consideration of risks, benefits and other options for treatment, the patient has consented to  Procedure(s) (LRB): Right BREAST LUMPECTOMY WITH NEEDLE LOCALIZATION AND AXILLARY SENTINEL LYMPH NODE BX (Right) as a surgical intervention .   The patients' history has been reviewed, patient examined, no change in status, stable for surgery.  I have reviewed the patients' chart and labs.  Questions were answered to the patient's satisfaction.     [name]

## 2012-01-20 NOTE — Anesthesia Postprocedure Evaluation (Signed)
  Anesthesia Post-op Note  Patient: Cindy Skinner  Procedure(s) Performed: Procedure(s) (LRB): BREAST LUMPECTOMY WITH NEEDLE LOCALIZATION AND AXILLARY SENTINEL LYMPH NODE BX (Right)  Patient Location: PACU  Anesthesia Type: General  Level of Consciousness: awake, alert  and oriented  Airway and Oxygen Therapy: Patient Spontanous Breathing  Post-op Pain: none  Post-op Assessment: Post-op Vital signs reviewed, Patient's Cardiovascular Status Stable, Respiratory Function Stable, Patent Airway, No signs of Nausea or vomiting and Pain level controlled  Post-op Vital Signs: Reviewed and stable  Complications: No apparent anesthesia complications

## 2012-01-20 NOTE — Discharge Instructions (Signed)
[facility] - DISCHARGE INSTRUCTIONS TO PATIENT  Return to work on:  About 7 days if doing well.  Activity:  Driving - may drive in 2 or 3 days, if doing well.   Lifting - < 15 pounds for one week, then unlimited.  Wound Care:   Leave wounds dry for 2 days, then may remove bandages and shower.  Diet:  As tolerated  Follow up appointment:  Call Dr. Allene Pyo office [facility]) at [phone] for an appointment in 10 - 14 days.  Medications and dosages:  Resume your home medications.  You have a prescription for:  Ultram.  Call Dr. Ezzard Standing or his office  [phone]) if you have:  Temperature greater than 100.4,  Persistent nausea and vomiting,  Severe uncontrolled pain,  Redness, tenderness, or signs of infection (pain, swelling, redness, odor or green/yellow discharge around the site),  Difficulty breathing, headache or visual disturbances,  Any other questions or concerns you may have after discharge.  In an emergency, call 911 or go to an Emergency Department at a nearby hospital.    Post Anesthesia Home Care Instructions  Activity: Get plenty of rest for the remainder of the day. A responsible adult should stay with you for 24 hours following the procedure.  For the next 24 hours, DO NOT: -Drive a car -Advertising copywriter -Drink alcoholic beverages -Take any medication unless instructed by your physician -Make any legal decisions or sign important papers.  Meals: Start with liquid foods such as gelatin or soup. Progress to regular foods as tolerated. Avoid greasy, spicy, heavy foods. If nausea and/or vomiting occur, drink only clear liquids until the nausea and/or vomiting subsides. Call your physician if vomiting continues.  Special Instructions/Symptoms: Your throat may feel dry or sore from the anesthesia or the breathing tube placed in your throat during surgery. If this causes discomfort, gargle with warm salt water. The discomfort should  disappear within 24 hours.

## 2012-02-03 ENCOUNTER — Encounter (INDEPENDENT_AMBULATORY_CARE_PROVIDER_SITE_OTHER): Payer: Self-pay | Admitting: Surgery

## 2012-02-03 ENCOUNTER — Ambulatory Visit (INDEPENDENT_AMBULATORY_CARE_PROVIDER_SITE_OTHER): Payer: BC Managed Care – PPO | Admitting: Surgery

## 2012-02-03 VITALS — BP 125/86 | HR 74 | Temp 98.2°F | Resp 18 | Ht 66.0 in | Wt 164.0 lb

## 2012-02-03 DIAGNOSIS — C50319 Malignant neoplasm of lower-inner quadrant of unspecified female breast: Secondary | ICD-10-CM

## 2012-02-03 NOTE — Progress Notes (Signed)
[facility]  Ovidio Kin, MD,  FACS [address] [place]  [address] [place], [place]    [address] Phone:  [phone] FAX:  [phone]   Re:   Cindy Skinner DOB:   15-Apr-1951 MRN:   [id number]  ASSESSMENT AND PLAN: 1. Right breast cancer, T1c, N0.   Final path - 2.0 cm IDC, 0/1 nodes  ER - 100, PR - 16, Ki67 - 19%, Her2 Neu - neg   Drs Welton Flakes and [facility] consulting oncology. She sees them both next week.  She is doing well post op.  To see me back in 6 months.  2. Trying to quit smoking.  3. Because of a sister with possible ovarian cancer, she is going for a genetics consultation.   HISTORY OF PRESENT ILLNESS: Chief Complaint  Patient presents with  . Follow-up    Cindy Skinner is a 61 y.o. (DOB: Feb 17, 1951)  white female who is a patient of Ria Comment, NP, [facility],  and comes to me today for follow up of left breast lumpectomy and left ASLNBx.  She has done well with surgery.  No complaints or areas of concern.  I gave her a copy of the path report.  PHYSICAL EXAM: BP 125/86  Pulse 74  Temp(Src) 98.2 F (36.8 C) (Temporal)  Resp 18  Ht 5\' 6"  (1.676 m)  Wt 164 lb (74.39 kg)  BMI 26.47 kg/m2  HEENT:  Pupils equal.  Dentition good.  No injury. NECK:  Supple.  No thyroid mass. LYMPH NODES:  No cervical, supraclavicular, or axillary adenopathy. BREASTS -  RIGHT:  Right breast incision at 12 o'clock looks good.   LEFT:  No palpable mass or nodule.  No nipple discharge. UPPER EXTREMITIES:  No evidence of lymphedema.  Right axillary incision looks good.  DATA REVIEWED: Path report.  I gave her a copy.  Ovidio Kin, MD, FACS Office:  [phone]

## 2012-02-06 ENCOUNTER — Telehealth: Payer: Self-pay | Admitting: Oncology

## 2012-02-06 ENCOUNTER — Ambulatory Visit (HOSPITAL_BASED_OUTPATIENT_CLINIC_OR_DEPARTMENT_OTHER): Payer: BC Managed Care – PPO | Admitting: Oncology

## 2012-02-06 ENCOUNTER — Encounter: Payer: Self-pay | Admitting: Oncology

## 2012-02-06 VITALS — BP 117/82 | HR 91 | Temp 97.3°F | Ht 66.0 in | Wt 163.2 lb

## 2012-02-06 DIAGNOSIS — Z8041 Family history of malignant neoplasm of ovary: Secondary | ICD-10-CM

## 2012-02-06 DIAGNOSIS — C50319 Malignant neoplasm of lower-inner quadrant of unspecified female breast: Secondary | ICD-10-CM

## 2012-02-06 DIAGNOSIS — Z170 Estrogen receptor positive status [ER+]: Secondary | ICD-10-CM

## 2012-02-06 DIAGNOSIS — C50219 Malignant neoplasm of upper-inner quadrant of unspecified female breast: Secondary | ICD-10-CM

## 2012-02-06 NOTE — Telephone Encounter (Signed)
S/w the pt and she is aware of her aug 2013 appts °

## 2012-02-06 NOTE — Progress Notes (Signed)
OFFICE PROGRESS NOTE  CC Dr. Ovidio Kin Dr. Lurline Hare  DIAGNOSIS: 61 year old female who was originally seen in the multidisciplinary breast clinic for new diagnosis of T1 C. Invasive ductal carcinoma of the right breast.  PRIOR THERAPY:  #1 patient is now status post lumpectomy with sentinel node biopsy that revealed a 2.0 cm invasive ductal carcinoma one sentinel node was negative for metastatic disease tumor was ER +100% PR +60% Ki-67 19% and HER-2/neu negative.  #2 patient does have a significant family history of ovarian cancer and patient originally was seen by Maylon Cos but at that time patient declined genetic testing. She is now open to this possibility and we will get her back to the genetic counselor.  CURRENT THERAPY: Patient will be referred to Dr. Michell Heinrich for postop radiation therapy.  INTERVAL HISTORY: Cindy Skinner 61 y.o. female returns for Followup visit after having had her lumpectomy. Clinically she seems to be doing well. She is quitting smoking she has a nicotine patch on. She otherwise feels well she tolerated the surgery well without any significant problems. She denies any nausea vomiting fevers chills night sweats headaches she has no shortness of breath no chest pains no palpitations no myalgias or arthralgias. She has no evidence of lymphedema in the right upper extremity. Remainder of the 10 point review of systems is negative.  MEDICAL HISTORY: Past Medical History  Diagnosis Date  . H/O bone density study 10/2011  . Wears dentures     upper denture  . Arthritis     knees  . Cancer 12/2011    right breast    ALLERGIES:  is allergic to percocet and vicodin.  MEDICATIONS:  Current Outpatient Prescriptions  Medication Sig Dispense Refill  . calcium-vitamin D (OSCAL WITH D) 500-200 MG-UNIT per tablet Take 2 tablets by mouth daily. PM      . ergocalciferol (VITAMIN D2) 50000 UNITS capsule Take 50,000 Units by mouth once a week. PM      .  nicotine (NICODERM CQ - DOSED IN MG/24 HOURS) 14 mg/24hr patch Place 1 patch onto the skin daily.      Marland Kitchen buPROPion (WELLBUTRIN XL) 150 MG 24 hr tablet Take 150 mg by mouth daily. PM        SURGICAL HISTORY:  Past Surgical History  Procedure Date  . Appendectomy   . Ovarian cyst removal   . Breast surgery age 13's    benign tumor right breast  . Anterior cervical discectomy 06/12/2006    and fusion C6-7  . Hysteroscopy w/d&c 12/13/2005    REVIEW OF SYSTEMS:  Pertinent items are noted in HPI.   PHYSICAL EXAMINATION: General appearance: alert, cooperative and appears stated age Lymph nodes: Cervical, supraclavicular, and axillary nodes normal. Resp: clear to auscultation bilaterally and normal percussion bilaterally Back: symmetric, no curvature. ROM normal. No CVA tenderness. Cardio: regular rate and rhythm, S1, S2 normal, no murmur, click, rub or gallop GI: soft, non-tender; bowel sounds normal; no masses,  no organomegaly Extremities: extremities normal, atraumatic, no cyanosis or edema Neurologic: Grossly normal Bilateral breast examination right breast reveals a well-healed surgical scar in the 12:00 position right axillary incision also looks good. There is no nipple discharge no erythema. Left breast no masses or nipple discharge  ECOG PERFORMANCE STATUS: 0 - Asymptomatic  Blood pressure 117/82, pulse 91, temperature 97.3 F (36.3 C), temperature source Oral, height 5\' 6"  (1.676 m), weight 163 lb 3.2 oz (74.027 kg).  LABORATORY DATA: Lab Results  Component Value  Date   WBC 4.4 01/11/2012   HGB 14.3 01/11/2012   HCT 42.1 01/11/2012   MCV 94.0 01/11/2012   PLT 250 01/11/2012      Chemistry      Component Value Date/Time   NA 142 01/11/2012 0805   K 3.7 01/11/2012 0805   CL 103 01/11/2012 0805   CO2 30 01/11/2012 0805   BUN 16 01/11/2012 0805   CREATININE 0.87 01/11/2012 0805      Component Value Date/Time   CALCIUM 9.7 01/11/2012 0805   ALKPHOS 86 01/11/2012 0805   AST 20  01/11/2012 0805   ALT 23 01/11/2012 0805   BILITOT 0.7 01/11/2012 0805     ADDITIONAL INFORMATION: 2. CHROMOGENIC IN-SITU HYBRIDIZATION Interpretation HER-2/NEU BY CISH - NO AMPLIFICATION OF HER-2 DETECTED. THE RATIO OF HER-2: CEP 17 SIGNALS WAS 1.20. Reference range: Ratio: HER2:CEP17 < 1.8 - gene amplification not observed Ratio: HER2:CEP 17 1.8-2.2 - equivocal result Ratio: HER2:CEP17 > 2.2 - gene amplification observed Pecola Leisure MD Pathologist, Electronic Signature ( Signed 02/01/2012) FINAL DIAGNOSIS Diagnosis 1. Lymph node, sentinel, biopsy, Right axillary - ONE BENIGN LYMPH NODE WITH ASSOCIATED PIGMENT. - NO EVIDENCE OF METASTATIC CARCINOMA (0/1). 2. Breast, lumpectomy, Right - INVASIVE DUCTAL CARCINOMA, 2.0 CM, NOTTINGHAM COMBINED HISTOLOGIC GRADE II - NO EVIDENCE OF ANGIOLYMPHATIC INVASION IDENTIFIED. - PLEASE SEE ONCOLOGY TEMPLATE FOR DETAILS. 3. Breast, excision, Right superior margin - BENIGN BREAST TISSUE, NO ATYPIA OR MALIGNANCY. - INKED CAUTERIZED ADDITIONAL SUPERIOR MARGIN, NEGATIVE FOR ATYPIA OR MALIGNANCY. 1 of 3 FINAL for Cindy Skinner, Cindy Skinner ([id number]) Microscopic Comment 2. BREAST, INVASIVE TUMOR, WITH LYMPH NODE SAMPLING Specimen, including laterality: Right breast Procedure: Lumpectomy Grade: II Tubule formation: 3 Nuclear pleomorphism: 2 Mitotic: 1 Tumor size (gross measurement): 2.0 cm Margins: Negative Invasive, distance to closest margin: Posterior/deep margin - 0.2 cm In-situ, distance to closest margin: N/A Lymphovascular invasion: Not identified Ductal carcinoma in situ: Not identified Lobular neoplasia: N/A Tumor focality: Unifocal Treatment effect: No Extent of tumor: Skin: No Nipple: N/A Skeletal muscle: N/A Lymph nodes: # examined: 1 Lymph nodes with metastasis: 0 Isolated tumor cells (< 0.2 mm): 0 Micrometastasis: (> 0.2 mm and < 2.0 mm): 0 Macrometastasis: (> 2.0 mm): 0 Extracapsular extension: 0 Breast prognostic profile:  Please correlate with previous case [id number] Estrogen receptor: 100%, positive, strong staining intensity Progesterone receptor: 16%, positive, strong staining intensity Her 2 neu: No amplification of Her 2 detected. The ratio of Her 2:CEP 17 signals was 1.73. Her 2 neu will be repeated an addendum will follow. Ki-67: 19% Non-neoplastic breast: Unremarkable. TNM: pT1c, pN0 (sn-) Abigail Miyamoto MD Pathologist, Electronic Signature (Case signed 01/24/2012)  RADIOGRAPHIC STUDIES:  01/20/2012  *RADIOLOGY REPORT*  Clinical Data:  Recently diagnosed right breast invasive ductal carcinoma.  NEEDLE LOCALIZATION USING ULTRASOUND GUIDANCE AND SPECIMEN RADIOGRAPH  The patient presents for needle localization prior to right lumpectomy.  I met with the patient and we discussed the procedure of needle localization including risks.  Specifically, we discussed the risks of bleeding and infection.  Informed written consent was given.  Using ultrasound guidance, sterile technique, local anesthesia and an ultrasound localizing needle, the recently biopsied invasive ductal carcinoma in the 1 o'clock position of the right breast, 12 cm from the nipple, was localized using a lateral approach.  Images from the ultrasound localization and a post localization mammogram were printed and sent to the operating room for Dr. Ezzard Standing.  Specimen radiography performed at Day Surgery demonstrates the wire tip, mass and biopsy marker clip  present in the tissue sample.  The specimen was marked for pathology.  IMPRESSION: Needle localization right breast.  No apparent complications.  Original Report Authenticated By: Darrol Angel, M.D.   Mm Breast Surgical Specimen  01/20/2012  *RADIOLOGY REPORT*  Clinical Data:  Recently diagnosed right breast invasive ductal carcinoma.  NEEDLE LOCALIZATION USING ULTRASOUND GUIDANCE AND SPECIMEN RADIOGRAPH  The patient presents for needle localization prior to right lumpectomy.  I met with the  patient and we discussed the procedure of needle localization including risks.  Specifically, we discussed the risks of bleeding and infection.  Informed written consent was given.  Using ultrasound guidance, sterile technique, local anesthesia and an ultrasound localizing needle, the recently biopsied invasive ductal carcinoma in the 1 o'clock position of the right breast, 12 cm from the nipple, was localized using a lateral approach.  Images from the ultrasound localization and a post localization mammogram were printed and sent to the operating room for Dr. Ezzard Standing.  Specimen radiography performed at Day Surgery demonstrates the wire tip, mass and biopsy marker clip present in the tissue sample.  The specimen was marked for pathology.  IMPRESSION: Needle localization right breast.  No apparent complications.  Original Report Authenticated By: Darrol Angel, M.D.    ASSESSMENT: 30-year-old female with  #1 T1 C. N0 invasive ductal carcinoma of the right breast status post lumpectomy with the final pathology revealing 2.0 cm ER positive PR positive HER-2/neu negative breast cancer with Ki-67 at 19%. Postop patient is doing well. Patient and I went over her final pathology results today.  #2 patient is quitting smoking and has a nicotine patch.  #3 patient will need adjuvant antiestrogen therapy in the form of an aromatase inhibitor. We did discuss this but this will begin after patient completes her radiation.  #4 patient will need referral to radiation oncology.   PLAN:   #1 we'll refer the patient back to Dr. Lurline Hare for consultation and radiation.  #2 once patient completes her radiation we will begin her on antiestrogen therapy with Arimidex 1 mg on a daily basis.  #3 I have set her up to see me back in about 2-3 months time.   All questions were answered. The patient knows to call the clinic with any problems, questions or concerns. We can certainly see the patient much sooner if  necessary.  I spent 30 minutes counseling the patient face to face. The total time spent in the appointment was 30 minutes.    Drue Second, MD Medical/Oncology [facility] [phone] (beeper) [phone] (Office)  02/06/2012, 12:51 PM

## 2012-02-06 NOTE — Patient Instructions (Signed)
Proceed with radiation See me back in 2 months

## 2012-02-07 ENCOUNTER — Encounter: Payer: Self-pay | Admitting: Radiation Oncology

## 2012-02-07 NOTE — Progress Notes (Signed)
61 year old female.   Right breast invasive ductal ca T1c, N0 ER, PR +, HER 2 -. Right breast lumpectomy and sentinel node biopsy done 01/20/2012. Dr. Welton Flakes plans on starting the patient on antiestrogen therapy following completion of radiation therapy. Dr. Ezzard Standing plans to follow up with patient in six months.   ZO:[id number] and vicodin No indication of a pacemaker No hx of radiation therapy

## 2012-02-08 ENCOUNTER — Ambulatory Visit
Admission: RE | Admit: 2012-02-08 | Discharge: 2012-02-08 | Disposition: A | Discharge status: Home / Self Care | Payer: BC Managed Care – PPO | Source: Ambulatory Visit | Attending: Radiation Oncology | Admitting: Radiation Oncology

## 2012-02-08 ENCOUNTER — Encounter: Payer: Self-pay | Admitting: Radiation Oncology

## 2012-02-08 VITALS — BP 122/80 | HR 82 | Resp 18 | Ht 66.0 in | Wt 165.3 lb

## 2012-02-08 DIAGNOSIS — Z79899 Other long term (current) drug therapy: Secondary | ICD-10-CM | POA: Insufficient documentation

## 2012-02-08 DIAGNOSIS — C50919 Malignant neoplasm of unspecified site of unspecified female breast: Secondary | ICD-10-CM | POA: Insufficient documentation

## 2012-02-08 DIAGNOSIS — C50319 Malignant neoplasm of lower-inner quadrant of unspecified female breast: Secondary | ICD-10-CM

## 2012-02-08 DIAGNOSIS — Z510 Encounter for antineoplastic radiation therapy: Secondary | ICD-10-CM | POA: Insufficient documentation

## 2012-02-08 NOTE — Progress Notes (Signed)
Please see progress note under physician encounter 

## 2012-02-08 NOTE — Progress Notes (Signed)
Complete PATIENT MEASURE OF DISTRESS worksheet with a score of 5 submitted to social work. Patient denies need to see social work today. Also, complete NUTRITION RISK SCREEN worksheet without concerns submitted to Zenovia Jarred, RD.

## 2012-02-08 NOTE — Progress Notes (Signed)
Patient presents to the clinic today unaccompanied for consult with Dr. Michell Heinrich reference xrt for breast ca. Patient is alert and oriented to person, place, and time. No distress noted. Steady gait noted. Pleasant affect noted. Patient denies breast pain at this time. Patient reports the surgical site of her right breast is tender. Patient denies nausea, vomiting, headache, dizziness, or diarrhea. Surgical breast incision are well approximated without redness, edema or drainage. Surgical incisions look great. Patient reports weight is stable. Patient reports eating and sleeping without difficulty. Patient has no complaints at this time. Patient reports that she is working full time. Reported all findings to Dr. Michell Heinrich.

## 2012-02-09 NOTE — Progress Notes (Signed)
   Department of Radiation Oncology  Phone:  [phone] Fax:        [phone]   Name: Cindy Skinner   DOB: 1951/06/14  MRN: [id number]    Date: 02/09/2012  Follow Up Visit Note  CC: No primary provider on file.  Diagnosis: T1cN0 Invasive ductal carcinoma of the right breast  Allergies:  Allergies  Allergen Reactions  . Percocet (Oxycodone-Acetaminophen) Nausea And Vomiting  . Vicodin (Hydrocodone-Acetaminophen) Nausea And Vomiting    Medications:  Current Outpatient Prescriptions  Medication Sig Dispense Refill  . calcium-vitamin D (OSCAL WITH D) 500-200 MG-UNIT per tablet Take 2 tablets by mouth daily. PM      . ergocalciferol (VITAMIN D2) 50000 UNITS capsule Take 50,000 Units by mouth once a week. PM      . nicotine (NICODERM CQ - DOSED IN MG/24 HOURS) 14 mg/24hr patch Place 1 patch onto the skin daily.        Interval History: [name] presents today for routine followup.  She has done well since her surgery and is back to work.  Her margins were negative. She discussed chemotherapy with Dr. Welton Flakes and declined oncotype testing as she does not want to take chemotherapy.  She is ready to begin radiation. She has quit smoking with the aid of nicotine patches and is excited about that.   Physical Exam:   height is 5\' 6"  (1.676 m) and weight is 165 lb 4.8 oz (74.98 kg). Her blood pressure is 122/80 and her pulse is 82. Her respiration is 18.  Well healed right lumpectomy incision.  SLN incision is healing as well.  IMPRESSION: T1cN0 Invasive Ductal Carcinoma of the right breast s/p lumpectomy   PLAN:  [name] is a 61 y.o. female who is ready for radiation.  We discussed the indications and benefits of radiation.  We discussed the process of simulation and the placement of tattoos.  We discussed hypofractionation and its equivalency in terms of outcomes and side effects to standard fractionation.  We discussed the possibility of lung and rib damage.  We discussed secondary  malignancies.  She signed informed consent and agreed to proceed forward.  We were able to fit her in for simulation later this afternoon.     Lurline Hare, MD

## 2012-02-09 NOTE — Progress Notes (Addendum)
Name: Cindy Skinner   MRN: [id number]  Date:  02/09/2012  DOB: [birth year]  Status:outpatient    DIAGNOSIS: Breast cancer.  CONSENT VERIFIED: yes   SET UP: Patient is setup supine   IMMOBILIZATION:  The following immobilization was used:Custom Moldable Pillow, breast board.   NARRATIVE: [name] was brought to the CT Simulation planning suite.  Identity was confirmed.  All relevant records and images related to the planned course of therapy were reviewed.  Then, the patient was positioned in a stable reproducible clinical set-up for radiation therapy.  Wires were placed to delineate the clinical extent of breast tissue. A wire was placed on the scar as well.  CT images were obtained.  An isocenter was placed. Skin markings were placed.  The CT images were loaded into the planning software where the target and avoidance structures were contoured.  The radiation prescription was entered and confirmed. The patient was discharged in stable condition and tolerated simulation well.    TREATMENT PLANNING NOTE:  Treatment planning then occurred. I have requested : MLC's, isodose plan, basic dose calculation

## 2012-02-15 ENCOUNTER — Ambulatory Visit
Admission: RE | Admit: 2012-02-15 | Discharge: 2012-02-15 | Disposition: A | Discharge status: Home / Self Care | Payer: BC Managed Care – PPO | Source: Ambulatory Visit | Attending: Radiation Oncology | Admitting: Radiation Oncology

## 2012-02-16 ENCOUNTER — Ambulatory Visit
Admission: RE | Admit: 2012-02-16 | Discharge: 2012-02-16 | Disposition: A | Discharge status: Home / Self Care | Payer: BC Managed Care – PPO | Source: Ambulatory Visit | Attending: Radiation Oncology | Admitting: Radiation Oncology

## 2012-02-16 DIAGNOSIS — C50319 Malignant neoplasm of lower-inner quadrant of unspecified female breast: Secondary | ICD-10-CM

## 2012-02-16 NOTE — Progress Notes (Signed)
  Radiation Oncology         [phone] ________________________________  Name: Cindy Skinner MRN: [id number]  Date: 02/16/2012  DOB: [birth year]  Simulation Verification Note  Status: outpatient  NARRATIVE: The patient was brought to the treatment unit and placed in the planned treatment position. The clinical setup was verified. Then port films were obtained and uploaded to the radiation oncology medical record software.  The treatment beams were carefully compared against the planned radiation fields. The position location and shape of the radiation fields was reviewed. They targeted volume of tissue appears to be appropriately covered by the radiation beams. Organs at risk appear to be excluded as planned.  Based on my personal review, I approved the simulation verification. The patient's treatment will proceed as planned.  -----------------------------------  Billie Lade, PhD, MD

## 2012-02-17 ENCOUNTER — Ambulatory Visit
Admission: RE | Admit: 2012-02-17 | Discharge: 2012-02-17 | Disposition: A | Discharge status: Home / Self Care | Payer: BC Managed Care – PPO | Source: Ambulatory Visit | Attending: Radiation Oncology | Admitting: Radiation Oncology

## 2012-02-17 MED ORDER — RADIAPLEXRX EX GEL
Freq: Once | CUTANEOUS | Status: AC
  Administered 2012-02-17: 12:00:00 via TOPICAL

## 2012-02-17 MED ORDER — ALRA NON-METALLIC DEODORANT (RAD-ONC)
1.0000 "application " | Freq: Once | TOPICAL | Status: AC
  Administered 2012-02-17: 1 via TOPICAL

## 2012-02-17 NOTE — Progress Notes (Signed)
Late entry from 02/16/2012 at 1800 Received patient in the clinic following initial treatment for post sim education with Sam, RN. Patient is alert and oriented to person, place and time. No distress noted. Steady gait noted. Pleasant affect noted. Patient denies pain at this time. Oriented patient to staff and routine of the clinic. Provided patient with Radiation Therapy and You handbook then, reviewed pertinent information. Provided patient with Radiaplex and Alra then, educated upon use. Educated patient on potential side effects and management. Provided patient with this writer's business card and encouraged to call with needs. Patient verbalized understanding of all things reviewed.

## 2012-02-20 ENCOUNTER — Ambulatory Visit
Admission: RE | Admit: 2012-02-20 | Discharge: 2012-02-20 | Disposition: A | Discharge status: Home / Self Care | Payer: BC Managed Care – PPO | Source: Ambulatory Visit | Attending: Radiation Oncology | Admitting: Radiation Oncology

## 2012-02-21 ENCOUNTER — Encounter: Payer: Self-pay | Admitting: Radiation Oncology

## 2012-02-21 ENCOUNTER — Ambulatory Visit
Admission: RE | Admit: 2012-02-21 | Discharge: 2012-02-21 | Disposition: A | Discharge status: Home / Self Care | Payer: BC Managed Care – PPO | Source: Ambulatory Visit | Attending: Radiation Oncology | Admitting: Radiation Oncology

## 2012-02-21 VITALS — BP 155/108 | HR 112 | Resp 18 | Wt 161.5 lb

## 2012-02-21 DIAGNOSIS — C50319 Malignant neoplasm of lower-inner quadrant of unspecified female breast: Secondary | ICD-10-CM

## 2012-02-21 NOTE — Progress Notes (Signed)
Weekly Management Note Current Dose: 8  Gy  Projected Dose:52.72  Gy   Narrative:  The patient presents for routine under treatment assessment.  CBCT/MVCT images/Port film x-rays were reviewed.  The chart was checked. Has retinitis treating with drops.  Sad to have lost cat and moving. Using radiaplex. No breast complaints.  Physical Findings: Weight: 161 lb 8 oz (73.256 kg). Unchanged.   Impression:  The patient is tolerating radiation.  Plan:  Continue treatment as planned. Continue radiaplex.

## 2012-02-21 NOTE — Progress Notes (Signed)
Patient presents to the clinic today unaccompanied for a PUT with Dr. Michell Heinrich. Patient is alert and oriented to person, place, and time. No distress noted. Steady gait noted. Pleasant affect noted. Patient denies pain at this time. Hyperpigmentation of right/treated breast noted without desquamation. Patient reports using Radiaplex bid as directed. Patient denies breast pain. Patient reports she was diagnosed with retinitis and has been prescribed three new eye drops. Patient's blood pressure elevated due to stress from work and treatment. Patient moving and can't take her cat of six years and she is still upset about that. Patient continues to wear smoking patch and hasn't started back smoking. Reported all findings to Dr. Michell Heinrich.

## 2012-02-22 ENCOUNTER — Ambulatory Visit: Payer: BC Managed Care – PPO | Admitting: Oncology

## 2012-02-22 ENCOUNTER — Ambulatory Visit
Admission: RE | Admit: 2012-02-22 | Discharge: 2012-02-22 | Disposition: A | Discharge status: Home / Self Care | Payer: BC Managed Care – PPO | Source: Ambulatory Visit | Attending: Radiation Oncology | Admitting: Radiation Oncology

## 2012-02-23 ENCOUNTER — Ambulatory Visit
Admission: RE | Admit: 2012-02-23 | Discharge: 2012-02-23 | Disposition: A | Discharge status: Home / Self Care | Payer: BC Managed Care – PPO | Source: Ambulatory Visit | Attending: Radiation Oncology | Admitting: Radiation Oncology

## 2012-02-24 ENCOUNTER — Ambulatory Visit: Payer: BC Managed Care – PPO

## 2012-02-27 ENCOUNTER — Ambulatory Visit
Admission: RE | Admit: 2012-02-27 | Discharge: 2012-02-27 | Disposition: A | Discharge status: Home / Self Care | Payer: BC Managed Care – PPO | Source: Ambulatory Visit | Attending: Radiation Oncology | Admitting: Radiation Oncology

## 2012-02-27 ENCOUNTER — Encounter: Payer: Self-pay | Admitting: Radiation Oncology

## 2012-02-28 ENCOUNTER — Encounter: Payer: Self-pay | Admitting: Radiation Oncology

## 2012-02-28 ENCOUNTER — Ambulatory Visit
Admission: RE | Admit: 2012-02-28 | Discharge: 2012-02-28 | Disposition: A | Discharge status: Home / Self Care | Payer: BC Managed Care – PPO | Source: Ambulatory Visit | Attending: Radiation Oncology | Admitting: Radiation Oncology

## 2012-02-28 VITALS — BP 135/86 | HR 92 | Resp 18 | Wt 164.2 lb

## 2012-02-28 DIAGNOSIS — C50319 Malignant neoplasm of lower-inner quadrant of unspecified female breast: Secondary | ICD-10-CM

## 2012-02-28 NOTE — Progress Notes (Signed)
Patient presents to the clinic today unaccompanied for a PUT with Dr. Michell Heinrich. Patient is alert and oriented to person, place, and time. No distress noted. Steady gait noted. Pleasant affect noted. Patient denies pain at this time. Hyperpigmentation of right/treated breast noted without desquamation. Patient reports using Radiaplex bid as directed. Patient denies breast pain. Patient reports that she went back to the Wellbutrin to smoking instead of the patches. Reported all findings to Dr. Michell Heinrich.

## 2012-02-28 NOTE — Progress Notes (Signed)
Weekly Management Note Current Dose: 21.36  Gy  Projected Dose: 52.72 Gy   Narrative:  The patient presents for routine under treatment assessment.  CBCT/MVCT images/Port film x-rays were reviewed.  The chart was checked. Doing well.  Much better week. Eye is the same. No pain.   Physical Findings: Weight: 164 lb 3.2 oz (74.481 kg). Slightly pink right breast  Impression:  The patient is tolerating radiation.  Plan:  Continue treatment as planned. Continue radiaplex.

## 2012-02-29 ENCOUNTER — Ambulatory Visit
Admission: RE | Admit: 2012-02-29 | Discharge: 2012-02-29 | Disposition: A | Discharge status: Home / Self Care | Payer: BC Managed Care – PPO | Source: Ambulatory Visit | Attending: Radiation Oncology | Admitting: Radiation Oncology

## 2012-03-02 ENCOUNTER — Ambulatory Visit
Admission: RE | Admit: 2012-03-02 | Discharge: 2012-03-02 | Disposition: A | Discharge status: Home / Self Care | Payer: BC Managed Care – PPO | Source: Ambulatory Visit | Attending: Radiation Oncology | Admitting: Radiation Oncology

## 2012-03-05 ENCOUNTER — Ambulatory Visit
Admission: RE | Admit: 2012-03-05 | Discharge: 2012-03-05 | Disposition: A | Discharge status: Home / Self Care | Payer: BC Managed Care – PPO | Source: Ambulatory Visit | Attending: Radiation Oncology | Admitting: Radiation Oncology

## 2012-03-06 ENCOUNTER — Ambulatory Visit
Admission: RE | Admit: 2012-03-06 | Discharge: 2012-03-06 | Disposition: A | Discharge status: Home / Self Care | Payer: BC Managed Care – PPO | Source: Ambulatory Visit | Attending: Radiation Oncology | Admitting: Radiation Oncology

## 2012-03-06 DIAGNOSIS — C50319 Malignant neoplasm of lower-inner quadrant of unspecified female breast: Secondary | ICD-10-CM

## 2012-03-06 MED ORDER — RADIAPLEXRX EX GEL
Freq: Once | CUTANEOUS | Status: AC
  Administered 2012-03-06: 10:00:00 via TOPICAL

## 2012-03-07 ENCOUNTER — Encounter: Payer: Self-pay | Admitting: Radiation Oncology

## 2012-03-07 ENCOUNTER — Ambulatory Visit
Admission: RE | Admit: 2012-03-07 | Discharge: 2012-03-07 | Disposition: A | Discharge status: Home / Self Care | Payer: BC Managed Care – PPO | Source: Ambulatory Visit | Attending: Radiation Oncology | Admitting: Radiation Oncology

## 2012-03-07 ENCOUNTER — Telehealth: Payer: Self-pay | Admitting: Radiation Oncology

## 2012-03-07 VITALS — BP 140/94 | HR 87 | Resp 18 | Wt 162.1 lb

## 2012-03-07 DIAGNOSIS — C50319 Malignant neoplasm of lower-inner quadrant of unspecified female breast: Secondary | ICD-10-CM

## 2012-03-07 NOTE — Telephone Encounter (Signed)
Pt was referred to me by B.J. Sintay, Ph.D., DABR, the chief physicist during chart rounds. She had expressed to him some financial concerns. I called her and she advised, that she has mailed a EPP appl to [facility] on yest. I am going to process ACS for her, as well as, refer pt to onsite SW's Leotis Shames & Abigail) at this time.

## 2012-03-07 NOTE — Progress Notes (Signed)
Patient presents to the clinic today unaccompanied for an under treat visit with Dr. Michell Heinrich. Patient is alert and oriented to person, place, and time. No distress noted. Steady gait noted. Pleasant affect noted. Patient denies pain at this time. Patient reports an occasional sharp shooting pain in her right/treated breast. Patient reports left eye retinitis is improving. Hyperpigmentation without desquamation noted. Patient reports using Radiaplex as directed. Patient reports difficulty remain asleep. Reported all findings to Dr. Michell Heinrich.

## 2012-03-07 NOTE — Progress Notes (Signed)
Weekly Management Note Current Dose: 34.71  Gy  Projected Dose: 52.72 Gy   Narrative:  The patient presents for routine under treatment assessment.  CBCT/MVCT images/Port film x-rays were reviewed.  The chart was checked. Doing well. Some insomnia. Worried about finances. Occasional brief sharp pain.  Physical Findings: Weight: 162 lb 1.6 oz (73.528 kg). Slightly pink right breast.  Impression:  The patient is tolerating radiation.  Plan:  Continue treatment as planned. Continue radiaplex. Will f/u with financial counselor tomorrow.

## 2012-03-08 ENCOUNTER — Encounter: Payer: Self-pay | Admitting: Radiation Oncology

## 2012-03-08 ENCOUNTER — Ambulatory Visit
Admission: RE | Admit: 2012-03-08 | Discharge: 2012-03-08 | Disposition: A | Discharge status: Home / Self Care | Payer: BC Managed Care – PPO | Source: Ambulatory Visit | Attending: Radiation Oncology | Admitting: Radiation Oncology

## 2012-03-08 NOTE — Progress Notes (Signed)
Name: Cindy Skinner   MRN: [id number]  Date:  03/08/2012   DOB: 02-21-1951  Status:outpatient    DIAGNOSIS: Breast cancer.  CONSENT VERIFIED: yes   SET UP: Patient is setup supine   IMMOBILIZATION:  The following immobilization was used:Custom Moldable Pillow, breast board.   NARRATIVE: [name] underwent complex simulation and treatment planning for her boost treatment today.  Her tumor volume was outlined on the planning CT scan. The depth of her cavity was 3 Cm.    9  MeV electrons will be prescribed to the 90%  Isodose line.   A block will be used for beam modification purposes.  A special port plan is requested.

## 2012-03-09 ENCOUNTER — Encounter: Payer: Self-pay | Admitting: *Deleted

## 2012-03-09 ENCOUNTER — Ambulatory Visit
Admission: RE | Admit: 2012-03-09 | Discharge: 2012-03-09 | Disposition: A | Discharge status: Home / Self Care | Payer: BC Managed Care – PPO | Source: Ambulatory Visit | Attending: Radiation Oncology | Admitting: Radiation Oncology

## 2012-03-09 NOTE — Progress Notes (Signed)
[facility] Brief Psychosocial Assessment Clinical Social Work  Clinical Social Work was referred by radiation oncology for assessment of psychosocial needs.  Clinical Social Worker met with patient in officer at [facility] to offer support and assess for needs.  Pt expressed increased financial stress due to medical bills and co-pays.  Pt is currently working full time and has insurance.  CSW offered pt support and reviewed financial resources available.  Pt has completed an EPP application, and waiting to see if she was approved.  CSW and pt reviewed multiple applications and identified necessary documentation needed to complete.  Pt plans to complete patient portion of the applications and gather necessary documents.  Pt will contact CSW when she is prepared to submit applications.  CSW informed pt of the supportive services at [facility] and encouraged her to call with any additional questions or concerns.        Patient identified barriers to care as  financial concerns  Clinical Social Work interventions: Special educational needs teacher in [employer] Cancer Care Patient [employer]  Tamala Julian, MSW, LCSW Clinical Social Worker [facility] [phone]

## 2012-03-12 ENCOUNTER — Ambulatory Visit: Admission: RE | Admit: 2012-03-12 | Payer: BC Managed Care – PPO | Source: Ambulatory Visit

## 2012-03-13 ENCOUNTER — Ambulatory Visit: Payer: BC Managed Care – PPO | Admitting: Radiation Oncology

## 2012-03-13 ENCOUNTER — Ambulatory Visit
Admission: RE | Admit: 2012-03-13 | Discharge: 2012-03-13 | Disposition: A | Discharge status: Home / Self Care | Payer: BC Managed Care – PPO | Source: Ambulatory Visit | Attending: Radiation Oncology | Admitting: Radiation Oncology

## 2012-03-14 ENCOUNTER — Ambulatory Visit
Admission: RE | Admit: 2012-03-14 | Discharge: 2012-03-14 | Disposition: A | Discharge status: Home / Self Care | Payer: BC Managed Care – PPO | Source: Ambulatory Visit | Attending: Radiation Oncology | Admitting: Radiation Oncology

## 2012-03-14 DIAGNOSIS — C50319 Malignant neoplasm of lower-inner quadrant of unspecified female breast: Secondary | ICD-10-CM

## 2012-03-14 MED ORDER — RADIAPLEXRX EX GEL
Freq: Once | CUTANEOUS | Status: AC
  Administered 2012-03-14: 16:00:00 via TOPICAL

## 2012-03-14 NOTE — Progress Notes (Signed)
Weekly Management Note Current Dose:  44.72 Gy  Projected Dose:52.72  Gy   Narrative:  The patient presents for routine under treatment assessment.  CBCT/MVCT images/Port film x-rays were reviewed.  The chart was checked. Doing well. Some soreness over scar.  Breast is red but no pain anywhere else. Met with SW and grateful for resources provided there.   Physical Findings: Unchanged  Impression:  The patient is tolerating radiation.  Plan:  Continue treatment as planned. Continue radiaplex. F/u in 1 month.

## 2012-03-15 ENCOUNTER — Ambulatory Visit
Admission: RE | Admit: 2012-03-15 | Discharge: 2012-03-15 | Disposition: A | Discharge status: Home / Self Care | Payer: BC Managed Care – PPO | Source: Ambulatory Visit | Attending: Radiation Oncology | Admitting: Radiation Oncology

## 2012-03-16 ENCOUNTER — Ambulatory Visit
Admission: RE | Admit: 2012-03-16 | Discharge: 2012-03-16 | Disposition: A | Discharge status: Home / Self Care | Payer: BC Managed Care – PPO | Source: Ambulatory Visit | Attending: Radiation Oncology | Admitting: Radiation Oncology

## 2012-03-19 ENCOUNTER — Ambulatory Visit: Payer: BC Managed Care – PPO

## 2012-03-19 ENCOUNTER — Ambulatory Visit
Admission: RE | Admit: 2012-03-19 | Discharge: 2012-03-19 | Disposition: A | Discharge status: Home / Self Care | Payer: BC Managed Care – PPO | Source: Ambulatory Visit | Attending: Radiation Oncology | Admitting: Radiation Oncology

## 2012-03-20 ENCOUNTER — Ambulatory Visit: Payer: BC Managed Care – PPO

## 2012-03-20 ENCOUNTER — Encounter: Payer: Self-pay | Admitting: Radiation Oncology

## 2012-03-20 ENCOUNTER — Ambulatory Visit
Admission: RE | Admit: 2012-03-20 | Discharge: 2012-03-20 | Disposition: A | Discharge status: Home / Self Care | Payer: BC Managed Care – PPO | Source: Ambulatory Visit | Attending: Radiation Oncology | Admitting: Radiation Oncology

## 2012-03-20 VITALS — BP 135/81 | HR 82 | Resp 18 | Wt 163.7 lb

## 2012-03-20 DIAGNOSIS — C50319 Malignant neoplasm of lower-inner quadrant of unspecified female breast: Secondary | ICD-10-CM

## 2012-03-20 MED ORDER — RADIAPLEXRX EX GEL
Freq: Once | CUTANEOUS | Status: AC
  Administered 2012-03-20: 18:00:00 via TOPICAL

## 2012-03-20 NOTE — Progress Notes (Signed)
Patient presents to the clinic today unaccompanied for an under treat visit with Dr. Michell Heinrich. Patient is alert and oriented to person, place, and time. No distress noted. Steady gait noted. Pleasant affect noted. Patient denies pain at this time. Patient reports soreness at scar. Hyperpigmentation without desquamation noted. Patient reports using Radiaplex as directed. Provided patient with an additional tube of radiaplex today and encouraged to use for the next two weeks. Patient reports her energy level is "good." patient has a follow up appointment with Dr. Michell Heinrich already scheduled. Encouraged patient to call with needs. Patient verbalized understanding. Reported all findings to Dr. Michell Heinrich.

## 2012-03-20 NOTE — Progress Notes (Signed)
Weekly Management Note Current Dose:  52.72 Gy  Projected Dose: 52.72 Gy   Narrative:  The patient presents for routine under treatment assessment.  CBCT/MVCT images/Port film x-rays were reviewed.  The chart was checked. Finishes RT today. Some skin irritation. Using radiaplex.   Physical Findings: Weight: 163 lb 11.2 oz (74.254 kg). Unchanged  Impression:  The patient finishes RT today.   Plan:  Continue treatment as planned. Continue radiaplex then switch to lotion with vit e. F/u in 1 month.

## 2012-03-21 ENCOUNTER — Ambulatory Visit: Payer: BC Managed Care – PPO

## 2012-03-22 ENCOUNTER — Ambulatory Visit: Payer: BC Managed Care – PPO

## 2012-03-23 ENCOUNTER — Ambulatory Visit: Payer: BC Managed Care – PPO

## 2012-03-26 ENCOUNTER — Ambulatory Visit: Payer: BC Managed Care – PPO

## 2012-03-26 NOTE — Progress Notes (Deleted)
  Radiation Oncology         [phone] ________________________________  Name: Cindy Skinner MRN: [id number]  Date: 03/20/2012  DOB: 03/29/1951  End of Treatment Note  Diagnosis:   Right breast cancer   Indication for treatment:  Curative    Radiation treatment dates:  02/16/2012-03/20/2012  Site/dose:    Right breast / 42.72 at 2.67 per fraction x 16 fractions Right boost/ 10 at 2 Gray per fraction x 5 fractions  Beams/energy:    Opposed tangents with reduced fields / 6 and 10 MV photons En face electrons / 9 MeV electrons  Narrative: The patient tolerated radiation treatment relatively well.   She continued to work full time and had minimal skin changes.   Plan: The patient has completed radiation treatment. The patient will return to radiation oncology clinic for routine followup in one month. I advised them to call or return sooner if they have any questions or concerns related to their recovery or treatment.  ------------------------------------------------  Stacy Wentworth, MD  

## 2012-03-27 ENCOUNTER — Ambulatory Visit: Payer: BC Managed Care – PPO

## 2012-03-28 ENCOUNTER — Ambulatory Visit: Payer: BC Managed Care – PPO

## 2012-03-29 ENCOUNTER — Ambulatory Visit: Payer: BC Managed Care – PPO

## 2012-03-30 ENCOUNTER — Ambulatory Visit: Payer: BC Managed Care – PPO

## 2012-04-02 ENCOUNTER — Ambulatory Visit: Payer: BC Managed Care – PPO

## 2012-04-03 ENCOUNTER — Ambulatory Visit: Payer: BC Managed Care – PPO

## 2012-04-15 NOTE — Progress Notes (Signed)
  Radiation Oncology         [phone] ________________________________  Name: Cindy Skinner MRN: [id number]  Date: 03/20/2012  DOB: [birth year]  End of Treatment Note  Diagnosis:   Right breast cancer   Indication for treatment:  Curative    Radiation treatment dates:  02/16/2012-03/20/2012  Site/dose:    Right breast / 42.72 at 2.67 per fraction x 16 fractions Right boost/ 10 at 2 Gray per fraction x 5 fractions  Beams/energy:    Opposed tangents with reduced fields / 6 and 10 MV photons En face electrons / 9 MeV electrons  Narrative: The patient tolerated radiation treatment relatively well.   She continued to work full time and had minimal skin changes.   Plan: The patient has completed radiation treatment. The patient will return to radiation oncology clinic for routine followup in one month. I advised them to call or return sooner if they have any questions or concerns related to their recovery or treatment.  ------------------------------------------------  Lurline Hare, MD

## 2012-04-16 ENCOUNTER — Encounter: Payer: Self-pay | Admitting: Oncology

## 2012-04-16 ENCOUNTER — Ambulatory Visit (HOSPITAL_BASED_OUTPATIENT_CLINIC_OR_DEPARTMENT_OTHER): Payer: BC Managed Care – PPO | Admitting: Oncology

## 2012-04-16 ENCOUNTER — Telehealth: Payer: Self-pay | Admitting: *Deleted

## 2012-04-16 VITALS — BP 109/75 | HR 69 | Temp 98.6°F | Resp 18 | Ht 66.0 in | Wt 166.4 lb

## 2012-04-16 DIAGNOSIS — C50319 Malignant neoplasm of lower-inner quadrant of unspecified female breast: Secondary | ICD-10-CM

## 2012-04-16 DIAGNOSIS — Z79811 Long term (current) use of aromatase inhibitors: Secondary | ICD-10-CM

## 2012-04-16 DIAGNOSIS — C50219 Malignant neoplasm of upper-inner quadrant of unspecified female breast: Secondary | ICD-10-CM

## 2012-04-16 DIAGNOSIS — Z170 Estrogen receptor positive status [ER+]: Secondary | ICD-10-CM

## 2012-04-16 HISTORY — DX: Long term (current) use of aromatase inhibitors: Z79.811

## 2012-04-16 MED ORDER — ANASTROZOLE 1 MG PO TABS: 1.0000 mg | ORAL_TABLET | Freq: Every day | ORAL | Status: AC

## 2012-04-16 NOTE — Progress Notes (Signed)
OFFICE PROGRESS NOTE  CC Dr. Ovidio Skinner Dr. Lurline Skinner Dr. 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  PRIOR THERAPY:  #1 patient is now status post lumpectomy with sentinel node biopsy that revealed a 2.0 cm invasive ductal carcinoma one sentinel node was negative for metastatic disease tumor was ER +100% PR +60% Ki-67 19% and HER-2/neu negative.  #2 patient does have a significant family history of ovarian cancer and patient originally was seen by Cindy Skinner but at that time patient declined genetic testing. She is now open to this possibility and we will get her back to the genetic counselor.  #3 patient is now status post radiation therapy administered by Dr. Michell Skinner. She completed her radiation March 20, 2012.   #4 patient will begin Arimidex 1 mg daily total of 5 years of therapy is planned.  CURRENT THERAPY: Arimidex 1 mg daily starting 04/16/2012. INTERVAL HISTORY: Cindy Skinner 61 y.o. female returns for Followup visit. Overall patient is doing well she tolerated radiation well without any significant problems. She today denies any fevers chills night sweats headaches shortness of breath chest pains palpitations she has not had any myalgias or arthralgias. She has no vaginal bleeding or discharge. No back pain. No hot flashes. Remainder of the 10 point review of systems is negative.  MEDICAL HISTORY: Past Medical History  Diagnosis Date  . H/O bone density study 10/2011  . Wears dentures     upper denture  . Arthritis     knees  . Breast cancer 12/2011    right breast    ALLERGIES:  is allergic to percocet and vicodin.  MEDICATIONS:  Current Outpatient Prescriptions  Medication Sig Dispense Refill  . calcium-vitamin D (OSCAL WITH D) 500-200 MG-UNIT per tablet Take 2 tablets by mouth daily. PM      . ergocalciferol  (VITAMIN D2) 50000 UNITS capsule Take 50,000 Units by mouth once a week. PM      . nicotine (NICODERM CQ - DOSED IN MG/24 HOURS) 14 mg/24hr patch Place 1 patch onto the skin daily.      . non-metallic deodorant Thornton Papas) MISC Apply 1 application topically daily as needed.      . Wound Cleansers (RADIAPLEX EX) Apply topically.      . predniSONE (DELTASONE) 5 MG tablet       . sulfamethoxazole-trimethoprim (BACTRIM,SEPTRA) 400-80 MG per tablet       . valACYclovir (VALTREX) 500 MG tablet         SURGICAL HISTORY:  Past Surgical History  Procedure Date  . Ovarian cyst removal   . Breast surgery age 41's    benign tumor right breast  . Anterior cervical discectomy 06/12/2006    and fusion C6-7  . Hysteroscopy w/d&c 12/13/2005  . Mastectomy partial / lumpectomy w/ axillary lymphadenectomy 01/20/2012    right breast lumpectomy with sentinel lymph node biopsy  . Appendectomy     REVIEW OF SYSTEMS:  Pertinent items are noted in HPI.   PHYSICAL EXAMINATION: General appearance: alert, cooperative and appears stated age Lymph nodes: Cervical, supraclavicular, and axillary nodes normal. Resp: clear to auscultation bilaterally and normal percussion bilaterally Back: symmetric, no curvature. ROM normal. No CVA tenderness. Cardio: regular rate and rhythm, S1, S2 normal, no murmur, click, rub or gallop GI: soft, non-tender; bowel sounds normal; no masses,  no organomegaly Extremities: extremities normal, atraumatic, no cyanosis  or edema Neurologic: Grossly normal Bilateral breast examination right breast reveals a well-healed surgical scar in the 12:00 position right axillary incision also looks good. There is no nipple discharge no erythema. Left breast no masses or nipple discharge  ECOG PERFORMANCE STATUS: 0 - Asymptomatic  Blood pressure 109/75, pulse 69, temperature 98.6 F (37 C), temperature source Oral, resp. rate 18, height 5\' 6"  (1.676 m), weight 166 lb 6.4 oz (75.479 kg).  LABORATORY  DATA: Lab Results  Component Value Date   WBC 4.4 01/11/2012   HGB 14.3 01/11/2012   HCT 42.1 01/11/2012   MCV 94.0 01/11/2012   PLT 250 01/11/2012      Chemistry      Component Value Date/Time   NA 142 01/11/2012 0805   K 3.7 01/11/2012 0805   CL 103 01/11/2012 0805   CO2 30 01/11/2012 0805   BUN 16 01/11/2012 0805   CREATININE 0.87 01/11/2012 0805      Component Value Date/Time   CALCIUM 9.7 01/11/2012 0805   ALKPHOS 86 01/11/2012 0805   AST 20 01/11/2012 0805   ALT 23 01/11/2012 0805   BILITOT 0.7 01/11/2012 0805     ADDITIONAL INFORMATION: 2. CHROMOGENIC IN-SITU HYBRIDIZATION Interpretation HER-2/NEU BY CISH - NO AMPLIFICATION OF HER-2 DETECTED. THE RATIO OF HER-2: CEP 17 SIGNALS WAS 1.20. Reference range: Ratio: HER2:CEP17 < 1.8 - gene amplification not observed Ratio: HER2:CEP 17 1.8-2.2 - equivocal result Ratio: HER2:CEP17 > 2.2 - gene amplification observed Cindy Leisure MD Pathologist, Electronic Signature ( Signed 02/01/2012) FINAL DIAGNOSIS Diagnosis 1. Lymph node, sentinel, biopsy, Right axillary - ONE BENIGN LYMPH NODE WITH ASSOCIATED PIGMENT. - NO EVIDENCE OF METASTATIC CARCINOMA (0/1). 2. Breast, lumpectomy, Right - INVASIVE DUCTAL CARCINOMA, 2.0 CM, NOTTINGHAM COMBINED HISTOLOGIC GRADE II - NO EVIDENCE OF ANGIOLYMPHATIC INVASION IDENTIFIED. - PLEASE SEE ONCOLOGY TEMPLATE FOR DETAILS. 3. Breast, excision, Right superior margin - BENIGN BREAST TISSUE, NO ATYPIA OR MALIGNANCY. - INKED CAUTERIZED ADDITIONAL SUPERIOR MARGIN, NEGATIVE FOR ATYPIA OR MALIGNANCY. 1 of 3 FINAL for Cindy Skinner ([id number]) Microscopic Comment 2. BREAST, INVASIVE TUMOR, WITH LYMPH NODE SAMPLING Specimen, including laterality: Right breast Procedure: Lumpectomy Grade: II Tubule formation: 3 Nuclear pleomorphism: 2 Mitotic: 1 Tumor size (gross measurement): 2.0 cm Margins: Negative Invasive, distance to closest margin: Posterior/deep margin - 0.2 cm In-situ, distance to closest  margin: N/A Lymphovascular invasion: Not identified Ductal carcinoma in situ: Not identified Lobular neoplasia: N/A Tumor focality: Unifocal Treatment effect: No Extent of tumor: Skin: No Nipple: N/A Skeletal muscle: N/A Lymph nodes: # examined: 1 Lymph nodes with metastasis: 0 Isolated tumor cells (< 0.2 mm): 0 Micrometastasis: (> 0.2 mm and < 2.0 mm): 0 Macrometastasis: (> 2.0 mm): 0 Extracapsular extension: 0 Breast prognostic profile: Please correlate with previous case [phone] Estrogen receptor: 100%, positive, strong staining intensity Progesterone receptor: 16%, positive, strong staining intensity Her 2 neu: No amplification of Her 2 detected. The ratio of Her 2:CEP 17 signals was 1.73. Her 2 neu will be repeated an addendum will follow. Ki-67: 19% Non-neoplastic breast: Unremarkable. TNM: pT1c, pN0 (sn-) Abigail Miyamoto MD Pathologist, Electronic Signature (Case signed 01/24/2012)  RADIOGRAPHIC STUDIES:  01/20/2012  *RADIOLOGY REPORT*  Clinical Data:  Recently diagnosed right breast invasive ductal carcinoma.  NEEDLE LOCALIZATION USING ULTRASOUND GUIDANCE AND SPECIMEN RADIOGRAPH  The patient presents for needle localization prior to right lumpectomy.  I met with the patient and we discussed the procedure of needle localization including risks.  Specifically, we discussed the risks of bleeding and infection.  Informed written consent was given.  Using ultrasound guidance, sterile technique, local anesthesia and an ultrasound localizing needle, the recently biopsied invasive ductal carcinoma in the 1 o'clock position of the right breast, 12 cm from the nipple, was localized using a lateral approach.  Images from the ultrasound localization and a post localization mammogram were printed and sent to the operating room for Dr. Ezzard Standing.  Specimen radiography performed at Day [facility] demonstrates the wire tip, mass and biopsy marker clip present in the tissue sample.  The specimen  was marked for pathology.  IMPRESSION: Needle localization right breast.  No apparent complications.  Original Report Authenticated By: Darrol Angel, M.D.   Mm Breast Surgical Specimen  01/20/2012  *RADIOLOGY REPORT*  Clinical Data:  Recently diagnosed right breast invasive ductal carcinoma.  NEEDLE LOCALIZATION USING ULTRASOUND GUIDANCE AND SPECIMEN RADIOGRAPH  The patient presents for needle localization prior to right lumpectomy.  I met with the patient and we discussed the procedure of needle localization including risks.  Specifically, we discussed the risks of bleeding and infection.  Informed written consent was given.  Using ultrasound guidance, sterile technique, local anesthesia and an ultrasound localizing needle, the recently biopsied invasive ductal carcinoma in the 1 o'clock position of the right breast, 12 cm from the nipple, was localized using a lateral approach.  Images from the ultrasound localization and a post localization mammogram were printed and sent to the operating room for Dr. Ezzard Standing.  Specimen radiography performed at Day Surgery demonstrates the wire tip, mass and biopsy marker clip present in the tissue sample.  The specimen was marked for pathology.  IMPRESSION: Needle localization right breast.  No apparent complications.  Original Report Authenticated By: Darrol Angel, M.D.    ASSESSMENT: 82-year-old female with  #1 T1 C. N0 invasive ductal carcinoma of the right breast status post lumpectomy with the final pathology revealing 2.0 cm ER positive PR positive HER-2/neu negative breast cancer with Ki-67 at 19%. Postop patient is doing well. Patient and I went over her final pathology results today.  #2 patient is quitting smoking and has a nicotine patch.  #3Patient has now completed her radiation therapy and she will begin antiestrogen therapy with Arimidex 1 mg daily. A total of 5 years of therapy is planned.    PLAN:  #1 patient will now proceed with antiestrogen  therapy consisting of Arimidex 1 mg daily. Risks and benefits of treatment were discussed with the patient. And a prescription was sent to her pharmacy of choice. Literature was also given to her and I went over in detail.  #2 I will plan on seeing the patient back in 3 months time for followup. However she knows to call me sooner if need arises.  #3 patient had a bone density scan performed at her gynecologist's office and we will get the results of that.  All questions were answered. The patient knows to call the clinic with any problems, questions or concerns. We can certainly see the patient much sooner if necessary.  I spent 30 minutes counseling the patient face to face. The total time spent in the appointment was 30 minutes.    Drue Second, MD Medical/Oncology [facility] [phone] (beeper) [phone] (Office)  04/16/2012, 11:38 AM

## 2012-04-16 NOTE — Patient Instructions (Addendum)
1. arimidex 1 mg daily, prescription sent to your pharmacy  2. Information as below on arimidex  3. i will see you back in 3 months. But lease call if you begin to have any problems at [phone]  4. Bone density scan to make sure you do not have any thinning of the bones   Anastrozole tablets What is this medicine? ANASTROZOLE (an AS troe zole) is used to treat breast cancer in women who have gone through menopause. Some types of breast cancer depend on estrogen to grow, and this medicine can stop tumor growth by blocking estrogen production. This medicine may be used for other purposes; ask your health care provider or pharmacist if you have questions. What should I tell my health care provider before I take this medicine? They need to know if you have any of these conditions: -liver disease -an unusual or allergic reaction to anastrozole, other medicines, foods, dyes, or preservatives -pregnant or trying to get pregnant -breast-feeding How should I use this medicine? Take this medicine by mouth with a glass of water. Follow the directions on the prescription label. You can take this medicine with or without food. Take your doses at regular intervals. Do not take your medicine more often than directed. Do not stop taking except on the advice of your doctor or health care professional. Talk to your pediatrician regarding the use of this medicine in children. Special care may be needed. Overdosage: If you think you have taken too much of this medicine contact a poison control center or emergency room at once. NOTE: This medicine is only for you. Do not share this medicine with others. What if I miss a dose? If you miss a dose, take it as soon as you can. If it is almost time for your next dose, take only that dose. Do not take double or extra doses. What may interact with this medicine? Do not take this medicine with any of the following medications: -female hormones, like estrogens or  progestins and birth control pills This medicine may also interact with the following medications: -tamoxifen This list may not describe all possible interactions. Give your health care provider a list of all the medicines, herbs, non-prescription drugs, or dietary supplements you use. Also tell them if you smoke, drink alcohol, or use illegal drugs. Some items may interact with your medicine. What should I watch for while using this medicine? Visit your doctor or health care professional for regular checks on your progress. Let your doctor or health care professional know about any unusual vaginal bleeding. Do not treat yourself for diarrhea, nausea, vomiting or other side effects. Ask your doctor or health care professional for advice. What side effects may I notice from receiving this medicine? Side effects that you should report to your doctor or health care professional as soon as possible: -allergic reactions like skin rash, itching or hives, swelling of the face, lips, or tongue -any new or unusual symptoms -breathing problems -chest pain -leg pain or swelling -vomiting Side effects that usually do not require medical attention (report to your doctor or health care professional if they continue or are bothersome): -back or bone pain -cough, or throat infection -diarrhea or constipation -dizziness -headache -hot flashes -loss of appetite -nausea -sweating -weakness and tiredness -weight gain This list may not describe all possible side effects. Call your doctor for medical advice about side effects. You may report side effects to FDA at 1-800-FDA-1088. Where should I keep my medicine? Keep out  of the reach of children. Store at room temperature between 20 and 25 degrees C (68 and 77 degrees F). Throw away any unused medicine after the expiration date. NOTE: This sheet is a summary. It may not cover all possible information. If you have questions about this medicine, talk to your  doctor, pharmacist, or health care provider.  2012, Elsevier/Gold Standard. (10/26/2007 4:31:52 PM)

## 2012-04-16 NOTE — Telephone Encounter (Signed)
Gave patient appointment for 07-18-2012 starting at 8:30am patient has an bone density done at [facility] hospital on 12-10-2011 [facility] report and fax over to [phone]

## 2012-04-18 ENCOUNTER — Encounter: Payer: Self-pay | Admitting: Radiation Oncology

## 2012-04-19 ENCOUNTER — Ambulatory Visit
Admission: RE | Admit: 2012-04-19 | Discharge: 2012-04-19 | Disposition: A | Discharge status: Home / Self Care | Payer: BC Managed Care – PPO | Source: Ambulatory Visit | Attending: Radiation Oncology | Admitting: Radiation Oncology

## 2012-04-19 ENCOUNTER — Encounter: Payer: Self-pay | Admitting: Radiation Oncology

## 2012-04-19 VITALS — BP 124/78 | HR 89 | Temp 97.2°F | Resp 18 | Wt 164.3 lb

## 2012-04-19 DIAGNOSIS — C50319 Malignant neoplasm of lower-inner quadrant of unspecified female breast: Secondary | ICD-10-CM

## 2012-04-19 NOTE — Progress Notes (Signed)
   Department of Radiation Oncology  Phone:  [phone] Fax:        [phone]   Name: Cindy Skinner   DOB: March 19, 1951  MRN: [id number]    Date: 04/19/2012  Follow Up Visit Note  Diagnosis: T1c N0 right breast cancer  Interval since last radiation: 1 month  Interval History: Cindy Skinner presents today for routine followup.  She is doing well and feeling well. She's been taking her Arimidex for 3 days and is not having any hot flashes or arthralgias. She continues to work full-time. She was denied any financial aid. She is set up on payment plans.  Allergies:  Allergies  Allergen Reactions  . Percocet (Oxycodone-Acetaminophen) Nausea And Vomiting  . Vicodin (Hydrocodone-Acetaminophen) Nausea And Vomiting    Medications:  Current Outpatient Prescriptions  Medication Sig Dispense Refill  . anastrozole (ARIMIDEX) 1 MG tablet Take 1 tablet (1 mg total) by mouth daily.  90 tablet  12  . calcium-vitamin D (OSCAL WITH D) 500-200 MG-UNIT per tablet Take 2 tablets by mouth daily. PM      . ergocalciferol (VITAMIN D2) 50000 UNITS capsule Take 50,000 Units by mouth once a week. PM      . predniSONE (DELTASONE) 5 MG tablet       . sulfamethoxazole-trimethoprim (BACTRIM,SEPTRA) 400-80 MG per tablet       . valACYclovir (VALTREX) 500 MG tablet       . nicotine (NICODERM CQ - DOSED IN MG/24 HOURS) 14 mg/24hr patch Place 1 patch onto the skin daily.      . non-metallic deodorant Thornton Papas) MISC Apply 1 application topically daily as needed.      . Wound Cleansers (RADIAPLEX EX) Apply topically.        Physical Exam:   weight is 164 lb 4.8 oz (74.526 kg). Her oral temperature is 97.2 F (36.2 C). Her blood pressure is 124/78 and her pulse is 89. Her respiration is 18.  He is a pleasant female in no distress sitting him down examined table. She has an excellent cosmetic result. Her skin is healed with no hyperpigmentation.  IMPRESSION: Cindy Skinner is a 61 y.o. female who has done well. She is  healed up nicely.  PLAN:  She will continue her Arimidex under the care of Dr. Welton Flakes. She is regular scheduled followup Dr. Ezzard Standing in December. I have not scheduled followup with me. I be happy to see her back on a when necessary basis.    Lurline Hare, MD

## 2012-04-19 NOTE — Progress Notes (Signed)
Patient presents to the clinic today unaccompanied for follow up with Dr. Michell Heinrich. Patient is alert and oriented to person, place, and time. No distress noted. Steady gait noted. Pleasant affect noted. Patient denies breast pain at this time. Patient reports only an occasional "twinge" in her right breast. Patient denies nipple discharge. Right/treated breast has returned to original/pre treatment color. Patient denies nausea, vomiting, headache, or dizziness. Patient reports eating and sleeping without difficulty. Patient has no complaints. Patient taking arimidex x3 days now. Follow up with Welton Flakes 11/20. Reported all findings to Dr. Michell Heinrich.

## 2012-05-08 ENCOUNTER — Telehealth: Payer: Self-pay | Admitting: Medical Oncology

## 2012-05-08 NOTE — Telephone Encounter (Signed)
Message copied by Tylene Fantasia on Tue May 08, 2012  3:30 PM ------      Message from: Victorino December      Created: Tue May 08, 2012  3:20 PM       yes      ----- Message -----         From: Tylene Fantasia, RN         Sent: 05/08/2012   2:35 PM           To: Victorino December, MD            Patient called stating that since she has been taking the Arimidex she has decreased/no appetite and has some nausea.            Hold for a week to see if symptoms resolve?

## 2012-05-08 NOTE — Telephone Encounter (Signed)
Spoke to patient, per MD, patient to hold Arimidex for one week to see if symptoms resolve.  "I have been taking them in the morning and I think I want to try taking it at night to see if that helps, since I will be asleep I wont notice the side effects as much."   Instructed patient that she could try this for a few nights to see if symptoms resolve or become tolerable, if not hold the Arimidex for one week to see if symptoms resolve and then return call to clinic to update [place].  Patient expressed understanding, no further questions at this time.

## 2012-07-18 ENCOUNTER — Ambulatory Visit (HOSPITAL_BASED_OUTPATIENT_CLINIC_OR_DEPARTMENT_OTHER): Payer: BC Managed Care – PPO | Admitting: Oncology

## 2012-07-18 ENCOUNTER — Telehealth: Payer: Self-pay | Admitting: Oncology

## 2012-07-18 ENCOUNTER — Other Ambulatory Visit (HOSPITAL_BASED_OUTPATIENT_CLINIC_OR_DEPARTMENT_OTHER): Payer: BC Managed Care – PPO | Admitting: Lab

## 2012-07-18 ENCOUNTER — Encounter: Payer: Self-pay | Admitting: Oncology

## 2012-07-18 VITALS — BP 102/71 | HR 65 | Temp 97.5°F | Resp 20 | Ht 66.0 in | Wt 158.0 lb

## 2012-07-18 DIAGNOSIS — Z170 Estrogen receptor positive status [ER+]: Secondary | ICD-10-CM

## 2012-07-18 DIAGNOSIS — C50319 Malignant neoplasm of lower-inner quadrant of unspecified female breast: Secondary | ICD-10-CM

## 2012-07-18 DIAGNOSIS — J40 Bronchitis, not specified as acute or chronic: Secondary | ICD-10-CM

## 2012-07-18 DIAGNOSIS — C50219 Malignant neoplasm of upper-inner quadrant of unspecified female breast: Secondary | ICD-10-CM

## 2012-07-18 DIAGNOSIS — F17200 Nicotine dependence, unspecified, uncomplicated: Secondary | ICD-10-CM

## 2012-07-18 LAB — CBC WITH DIFFERENTIAL/PLATELET
BASO%: 0.7 % (ref 0.0–2.0)
EOS%: 4.1 % (ref 0.0–7.0)
HCT: 39.4 % (ref 34.8–46.6)
LYMPH%: 29.6 % (ref 14.0–49.7)
MCH: 33.6 pg (ref 25.1–34.0)
MCHC: 34.9 g/dL (ref 31.5–36.0)
MONO#: 0.5 10*3/uL (ref 0.1–0.9)
MONO%: 11.3 % (ref 0.0–14.0)
NEUT%: 54.3 % (ref 38.4–76.8)
Platelets: 236 10*3/uL (ref 145–400)
RBC: 4.08 10*6/uL (ref 3.70–5.45)
WBC: 4.5 10*3/uL (ref 3.9–10.3)

## 2012-07-18 LAB — VITAMIN D 25 HYDROXY (VIT D DEFICIENCY, FRACTURES): Vit D, 25-Hydroxy: 48 ng/mL (ref 30–89)

## 2012-07-18 LAB — COMPREHENSIVE METABOLIC PANEL (CC13)
ALT: 33 U/L (ref 0–55)
AST: 29 U/L (ref 5–34)
Alkaline Phosphatase: 106 U/L (ref 40–150)
BUN: 17 mg/dL (ref 7.0–26.0)
Calcium: 10 mg/dL (ref 8.4–10.4)
Creatinine: 0.8 mg/dL (ref 0.6–1.1)
Total Bilirubin: 0.99 mg/dL (ref 0.20–1.20)

## 2012-07-18 MED ORDER — AZITHROMYCIN 250 MG PO TABS: ORAL_TABLET | ORAL | Status: DC

## 2012-07-18 NOTE — Telephone Encounter (Signed)
gve the pt her may 2014 appt calendar 

## 2012-07-18 NOTE — Patient Instructions (Addendum)
Continue arimidex daily  Quit smoking  Azithromycin for bronchitis  We will see you back in 6 months

## 2012-07-18 NOTE — Progress Notes (Signed)
OFFICE PROGRESS NOTE  CC Dr. Ovidio Kin Dr. Lurline Hare Dr. 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  PRIOR THERAPY:  #1 patient is now status post lumpectomy with sentinel node biopsy that revealed a 2.0 cm invasive ductal carcinoma one sentinel node was negative for metastatic disease tumor was ER +100% PR +60% Ki-67 19% and HER-2/neu negative.  #2 patient does have a significant family history of ovarian cancer and patient originally was seen by Maylon Cos but at that time patient declined genetic testing. She is now open to this possibility and we will get her back to the genetic counselor.  #3 patient is now status post radiation therapy administered by Dr. Michell Heinrich. She completed her radiation March 20, 2012.   #4 patient began Arimidex 1 mg daily total of 5 years of therapy is planned.  CURRENT THERAPY: Arimidex 1 mg daily starting 04/16/2012. INTERVAL HISTORY: Cindy Skinner 61 y.o. female returns for Followup visit. Overall patient is doing well she does have what sounds like bronchitis she continues to smoke. She otherwise denies any nausea or vomiting. She is taking her Arimidex at nighttime and that is helping her about nausea. She has no headaches double vision blurring of vision no fevers chills night sweats. No aches or pains no bleeding problems. Remainder of the 10 point review of systems is negative.  MEDICAL HISTORY: Past Medical History  Diagnosis Date  . H/O bone density study 10/2011  . Wears dentures     upper denture  . Arthritis     knees  . Breast cancer 12/2011    right breast  . S/P radiation therapy 02/16/12 - 03/20/12    Right Breast  . Use of anastrozole (Arimidex) 04/16/12    ALLERGIES:  is allergic to percocet and vicodin.  MEDICATIONS:  Current Outpatient Prescriptions  Medication Sig Dispense  Refill  . anastrozole (ARIMIDEX) 1 MG tablet Take 1 mg by mouth daily.      . calcium-vitamin D (OSCAL WITH D) 500-200 MG-UNIT per tablet Take 2 tablets by mouth daily. PM      . ergocalciferol (VITAMIN D2) 50000 UNITS capsule Take 50,000 Units by mouth once a week. PM      . nicotine (NICODERM CQ - DOSED IN MG/24 HOURS) 14 mg/24hr patch Place 1 patch onto the skin daily.      . non-metallic deodorant Thornton Papas) MISC Apply 1 application topically daily as needed.      . predniSONE (DELTASONE) 5 MG tablet       . sulfamethoxazole-trimethoprim (BACTRIM,SEPTRA) 400-80 MG per tablet       . valACYclovir (VALTREX) 500 MG tablet       . Wound Cleansers (RADIAPLEX EX) Apply topically.        SURGICAL HISTORY:  Past Surgical History  Procedure Date  . Ovarian cyst removal   . Breast surgery age 69's    benign tumor right breast  . Anterior cervical discectomy 06/12/2006    and fusion C6-7  . Hysteroscopy w/d&c 12/13/2005  . Mastectomy partial / lumpectomy w/ axillary lymphadenectomy 01/20/2012    right breast lumpectomy with sentinel lymph node biopsy  . Appendectomy     REVIEW OF SYSTEMS:  Pertinent items are noted in HPI.   PHYSICAL EXAMINATION: General appearance: alert, cooperative and appears stated age Lymph nodes: Cervical, supraclavicular, and axillary nodes normal. Resp: clear to auscultation  bilaterally and normal percussion bilaterally Back: symmetric, no curvature. ROM normal. No CVA tenderness. Cardio: regular rate and rhythm, S1, S2 normal, no murmur, click, rub or gallop GI: soft, non-tender; bowel sounds normal; no masses,  no organomegaly Extremities: extremities normal, atraumatic, no cyanosis or edema Neurologic: Grossly normal Bilateral breast examination right breast reveals a well-healed surgical scar in the 12:00 position right axillary incision also looks good. There is no nipple discharge no erythema. Left breast no masses or nipple discharge  ECOG PERFORMANCE STATUS:  0 - Asymptomatic  Blood pressure 102/71, pulse 65, temperature 97.5 F (36.4 C), temperature source Oral, resp. rate 20, height 5\' 6"  (1.676 m), weight 158 lb (71.668 kg).  LABORATORY DATA: Lab Results  Component Value Date   WBC 4.5 07/18/2012   HGB 13.7 07/18/2012   HCT 39.4 07/18/2012   MCV 96.5 07/18/2012   PLT 236 07/18/2012      Chemistry      Component Value Date/Time   NA 143 07/18/2012 0829   NA 142 01/11/2012 0805   K 4.2 07/18/2012 0829   K 3.7 01/11/2012 0805   CL 108* 07/18/2012 0829   CL 103 01/11/2012 0805   CO2 28 07/18/2012 0829   CO2 30 01/11/2012 0805   BUN 17.0 07/18/2012 0829   BUN 16 01/11/2012 0805   CREATININE 0.8 07/18/2012 0829   CREATININE 0.87 01/11/2012 0805      Component Value Date/Time   CALCIUM 10.0 07/18/2012 0829   CALCIUM 9.7 01/11/2012 0805   ALKPHOS 106 07/18/2012 0829   ALKPHOS 86 01/11/2012 0805   AST 29 07/18/2012 0829   AST 20 01/11/2012 0805   ALT 33 07/18/2012 0829   ALT 23 01/11/2012 0805   BILITOT 0.99 07/18/2012 0829   BILITOT 0.7 01/11/2012 0805     ADDITIONAL INFORMATION: 2. CHROMOGENIC IN-SITU HYBRIDIZATION Interpretation HER-2/NEU BY CISH - NO AMPLIFICATION OF HER-2 DETECTED. THE RATIO OF HER-2: CEP 17 SIGNALS WAS 1.20. Reference range: Ratio: HER2:CEP17 < 1.8 - gene amplification not observed Ratio: HER2:CEP 17 1.8-2.2 - equivocal result Ratio: HER2:CEP17 > 2.2 - gene amplification observed Pecola Leisure MD Pathologist, Electronic Signature ( Signed 02/01/2012) FINAL DIAGNOSIS Diagnosis 1. Lymph node, sentinel, biopsy, Right axillary - ONE BENIGN LYMPH NODE WITH ASSOCIATED PIGMENT. - NO EVIDENCE OF METASTATIC CARCINOMA (0/1). 2. Breast, lumpectomy, Right - INVASIVE DUCTAL CARCINOMA, 2.0 CM, NOTTINGHAM COMBINED HISTOLOGIC GRADE II - NO EVIDENCE OF ANGIOLYMPHATIC INVASION IDENTIFIED. - PLEASE SEE ONCOLOGY TEMPLATE FOR DETAILS. 3. Breast, excision, Right superior margin - BENIGN BREAST TISSUE, NO ATYPIA OR  MALIGNANCY. - INKED CAUTERIZED ADDITIONAL SUPERIOR MARGIN, NEGATIVE FOR ATYPIA OR MALIGNANCY. 1 of 3 FINAL for KETINA, MARS ([id number]) Microscopic Comment 2. BREAST, INVASIVE TUMOR, WITH LYMPH NODE SAMPLING Specimen, including laterality: Right breast Procedure: Lumpectomy Grade: II Tubule formation: 3 Nuclear pleomorphism: 2 Mitotic: 1 Tumor size (gross measurement): 2.0 cm Margins: Negative Invasive, distance to closest margin: Posterior/deep margin - 0.2 cm In-situ, distance to closest margin: N/A Lymphovascular invasion: Not identified Ductal carcinoma in situ: Not identified Lobular neoplasia: N/A Tumor focality: Unifocal Treatment effect: No Extent of tumor: Skin: No Nipple: N/A Skeletal muscle: N/A Lymph nodes: # examined: 1 Lymph nodes with metastasis: 0 Isolated tumor cells (< 0.2 mm): 0 Micrometastasis: (> 0.2 mm and < 2.0 mm): 0 Macrometastasis: (> 2.0 mm): 0 Extracapsular extension: 0 Breast prognostic profile: Please correlate with previous case [phone] Estrogen receptor: 100%, positive, strong staining intensity Progesterone receptor: 16%, positive, strong staining intensity Her 2 neu:  No amplification of Her 2 detected. The ratio of Her 2:CEP 17 signals was 1.73. Her 2 neu will be repeated an addendum will follow. Ki-67: 19% Non-neoplastic breast: Unremarkable. TNM: pT1c, pN0 (sn-) Abigail Miyamoto MD Pathologist, Electronic Signature (Case signed 01/24/2012)  RADIOGRAPHIC STUDIES:  01/20/2012  *RADIOLOGY REPORT*  Clinical Data:  Recently diagnosed right breast invasive ductal carcinoma.  NEEDLE LOCALIZATION USING ULTRASOUND GUIDANCE AND SPECIMEN RADIOGRAPH  The patient presents for needle localization prior to right lumpectomy.  I met with the patient and we discussed the procedure of needle localization including risks.  Specifically, we discussed the risks of bleeding and infection.  Informed written consent was given.  Using ultrasound  guidance, sterile technique, local anesthesia and an ultrasound localizing needle, the recently biopsied invasive ductal carcinoma in the 1 o'clock position of the right breast, 12 cm from the nipple, was localized using a lateral approach.  Images from the ultrasound localization and a post localization mammogram were printed and sent to the operating room for Dr. Ezzard Standing.  Specimen radiography performed at [facility] demonstrates the wire tip, mass and biopsy marker clip present in the tissue sample.  The specimen was marked for pathology.  IMPRESSION: Needle localization right breast.  No apparent complications.  Original Report Authenticated By: Darrol Angel, M.D.   Mm Breast Surgical Specimen  01/20/2012  *RADIOLOGY REPORT*  Clinical Data:  Recently diagnosed right breast invasive ductal carcinoma.  NEEDLE LOCALIZATION USING ULTRASOUND GUIDANCE AND SPECIMEN RADIOGRAPH  The patient presents for needle localization prior to right lumpectomy.  I met with the patient and we discussed the procedure of needle localization including risks.  Specifically, we discussed the risks of bleeding and infection.  Informed written consent was given.  Using ultrasound guidance, sterile technique, local anesthesia and an ultrasound localizing needle, the recently biopsied invasive ductal carcinoma in the 1 o'clock position of the right breast, 12 cm from the nipple, was localized using a lateral approach.  Images from the ultrasound localization and a post localization mammogram were printed and sent to the operating room for Dr. Ezzard Standing.  Specimen radiography performed at Day Surgery demonstrates the wire tip, mass and biopsy marker clip present in the tissue sample.  The specimen was marked for pathology.  IMPRESSION: Needle localization right breast.  No apparent complications.  Original Report Authenticated By: Darrol Angel, M.D.    ASSESSMENT: 41-year-old female with  #1 T1 C. N0 invasive ductal carcinoma of the  right breast status post lumpectomy with the final pathology revealing 2.0 cm ER positive PR positive HER-2/neu negative breast cancer with Ki-67 at 19%. Postop patient is doing well. Patient is now status post radiation therapy and she subsequently began Arimidex 1 mg daily. Overall she is tolerating it well. She has no evidence of recurrent disease.  #2 patient continues to smoke  #3 bronchitis   PLAN:  #1 continue Arimidex 1 mg daily patient is tolerating it well.  #2 bronchitis she will begin azithromycin in the form of Z-Pak.  #3 patient is encouraged to quit smoking  #4 followup in 6 months time  All questions were answered. The patient knows to call the clinic with any problems, questions or concerns. We can certainly see the patient much sooner if necessary.  I spent 25 minutes counseling the patient face to face. The total time spent in the appointment was 30 minutes.    Drue Second, MD Medical/Oncology [facility] [phone] (beeper) [phone] (Office)  07/18/2012, 9:20 AM

## 2012-08-29 DIAGNOSIS — E785 Hyperlipidemia, unspecified: Secondary | ICD-10-CM

## 2012-08-29 HISTORY — DX: Hyperlipidemia, unspecified: E78.5

## 2012-09-13 ENCOUNTER — Encounter (INDEPENDENT_AMBULATORY_CARE_PROVIDER_SITE_OTHER): Payer: BC Managed Care – PPO | Admitting: Surgery

## 2012-11-07 ENCOUNTER — Other Ambulatory Visit: Payer: Self-pay | Admitting: Obstetrics and Gynecology

## 2012-11-07 DIAGNOSIS — Z853 Personal history of malignant neoplasm of breast: Secondary | ICD-10-CM

## 2012-11-08 ENCOUNTER — Other Ambulatory Visit: Payer: Self-pay | Admitting: Nurse Practitioner

## 2012-12-10 ENCOUNTER — Ambulatory Visit
Admission: RE | Admit: 2012-12-10 | Discharge: 2012-12-10 | Disposition: A | Discharge status: Home / Self Care | Payer: BC Managed Care – PPO | Source: Ambulatory Visit | Attending: Obstetrics and Gynecology | Admitting: Obstetrics and Gynecology

## 2012-12-10 DIAGNOSIS — Z853 Personal history of malignant neoplasm of breast: Secondary | ICD-10-CM

## 2013-01-16 ENCOUNTER — Telehealth: Payer: Self-pay | Admitting: Oncology

## 2013-01-17 ENCOUNTER — Ambulatory Visit: Payer: BC Managed Care – PPO | Admitting: Adult Health

## 2013-01-17 ENCOUNTER — Other Ambulatory Visit: Payer: BC Managed Care – PPO | Admitting: Lab

## 2013-02-11 ENCOUNTER — Telehealth: Payer: Self-pay | Admitting: *Deleted

## 2013-02-11 NOTE — Telephone Encounter (Signed)
[facility] report received and scanned.  1 year MM[employer] recall entered for 02-10-14.

## 2013-04-26 ENCOUNTER — Other Ambulatory Visit: Payer: Self-pay | Admitting: *Deleted

## 2013-04-26 DIAGNOSIS — C50311 Malignant neoplasm of lower-inner quadrant of right female breast: Secondary | ICD-10-CM

## 2013-04-26 MED ORDER — ANASTROZOLE 1 MG PO TABS: 1.0000 mg | ORAL_TABLET | Freq: Every day | ORAL | Status: DC

## 2013-04-26 NOTE — Telephone Encounter (Signed)
PT. WAS AT A [facility] OUT OF TOWN. HER PRESCRIPTION HAD EXPIRED. CALLED IN A NEW PRESCRIPTION TO GET PT. TO HER 11/11/13 APPOINTMENT WITH DR.KHAN.

## 2013-09-10 ENCOUNTER — Other Ambulatory Visit: Payer: Self-pay | Admitting: Nurse Practitioner

## 2013-09-10 DIAGNOSIS — Z853 Personal history of malignant neoplasm of breast: Secondary | ICD-10-CM

## 2013-09-26 ENCOUNTER — Telehealth: Payer: Self-pay | Admitting: Oncology

## 2013-09-26 ENCOUNTER — Other Ambulatory Visit: Payer: Self-pay | Admitting: Emergency Medicine

## 2013-09-26 MED ORDER — ANASTROZOLE 1 MG PO TABS: 1.0000 mg | ORAL_TABLET | Freq: Every day | ORAL | Status: DC

## 2013-09-26 NOTE — Telephone Encounter (Signed)
, °

## 2013-10-06 ENCOUNTER — Other Ambulatory Visit: Payer: Self-pay | Admitting: Nurse Practitioner

## 2013-10-07 NOTE — Telephone Encounter (Signed)
Last refilled: AEX 11/08/12 #90/3 refills Current AEX scheduled for 11/11/13   Okay to refill?  Please Advise.  (Chart In Your Door)

## 2013-10-25 ENCOUNTER — Other Ambulatory Visit: Payer: Self-pay | Admitting: Oncology

## 2013-11-02 IMAGING — CR DG CHEST 2V
2 series · 2 of 2 positions shown · non-contrast
Comparison: None

CLINICAL DATA: Preop.  Breast cancer.

CHEST - 2 VIEW

[w chest pa]
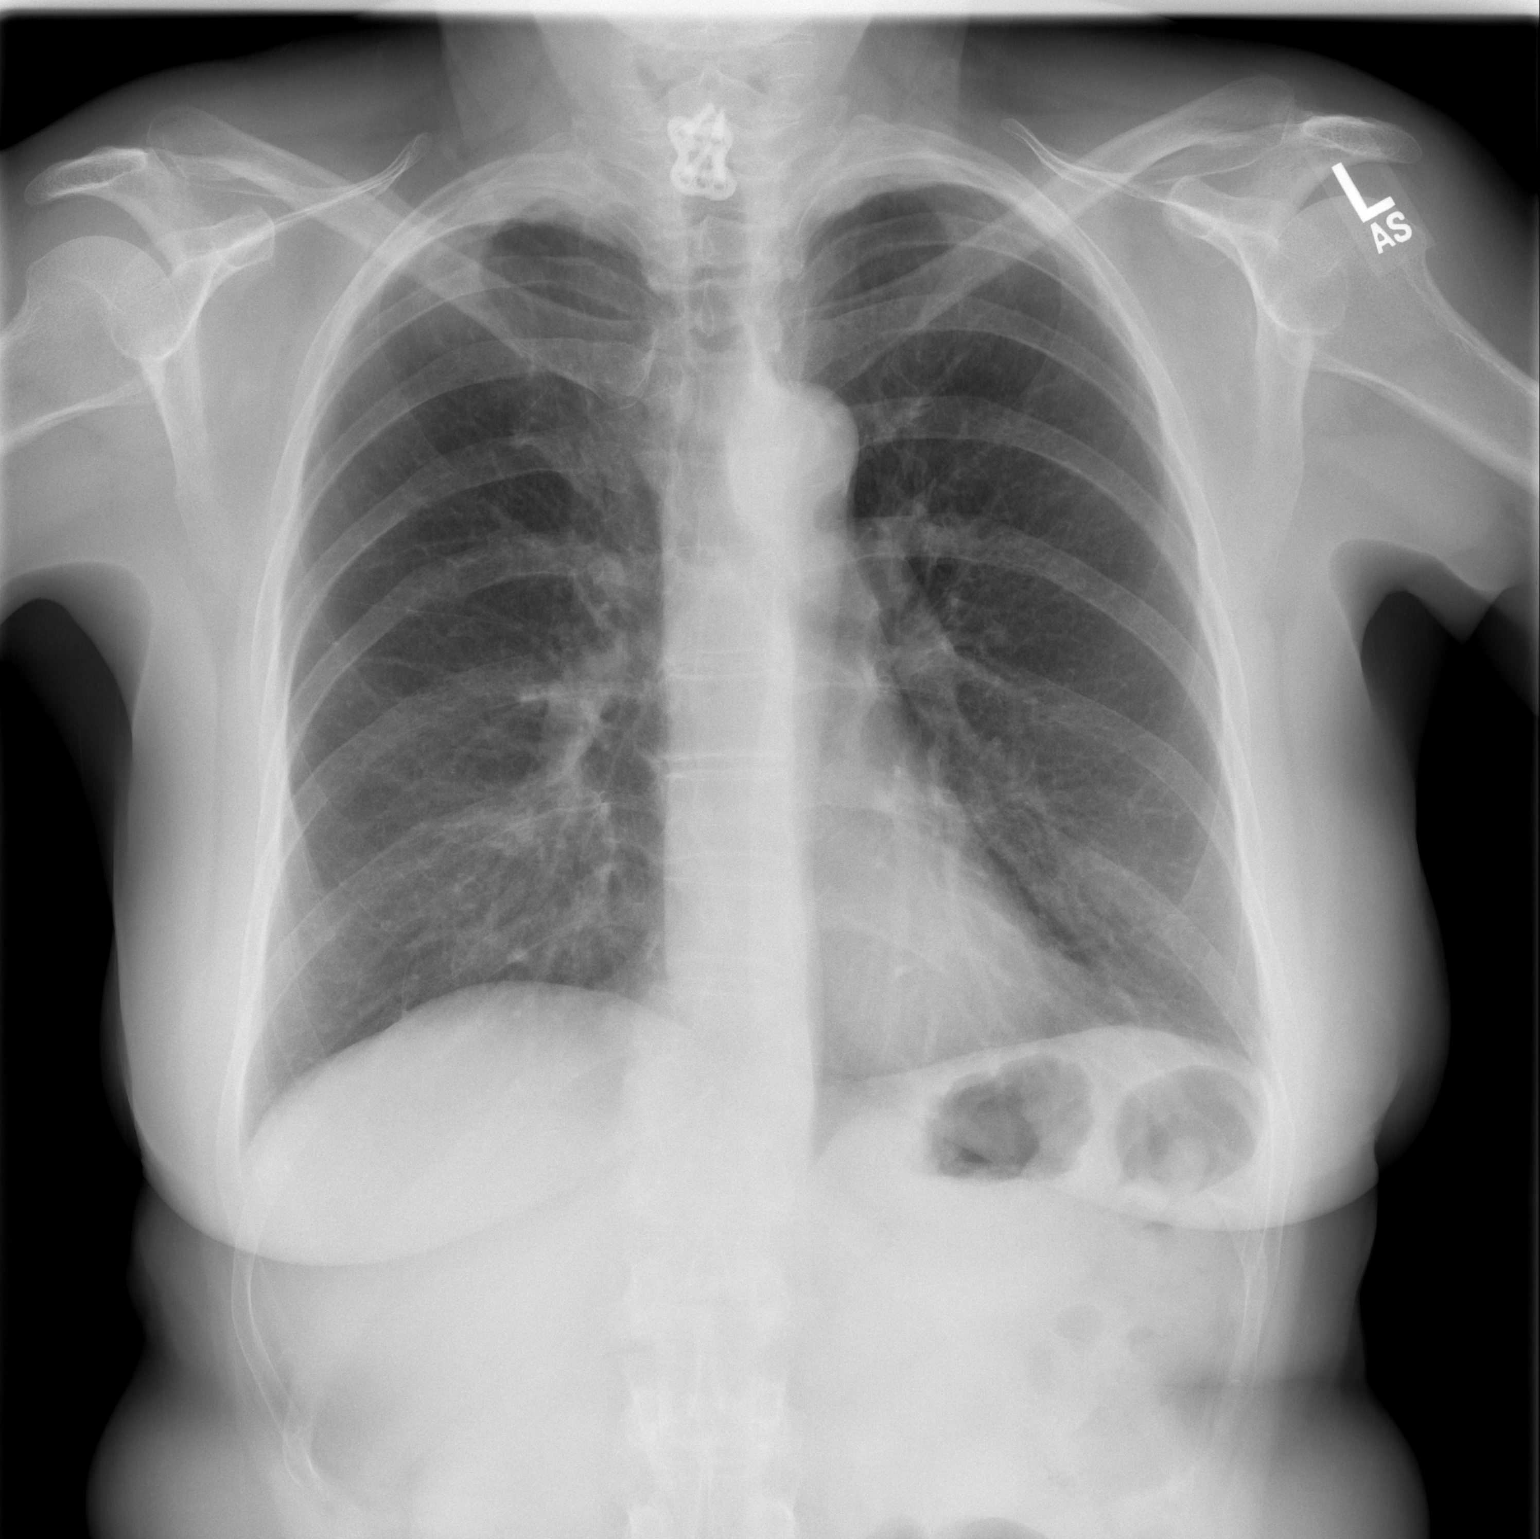

[w chest lat]
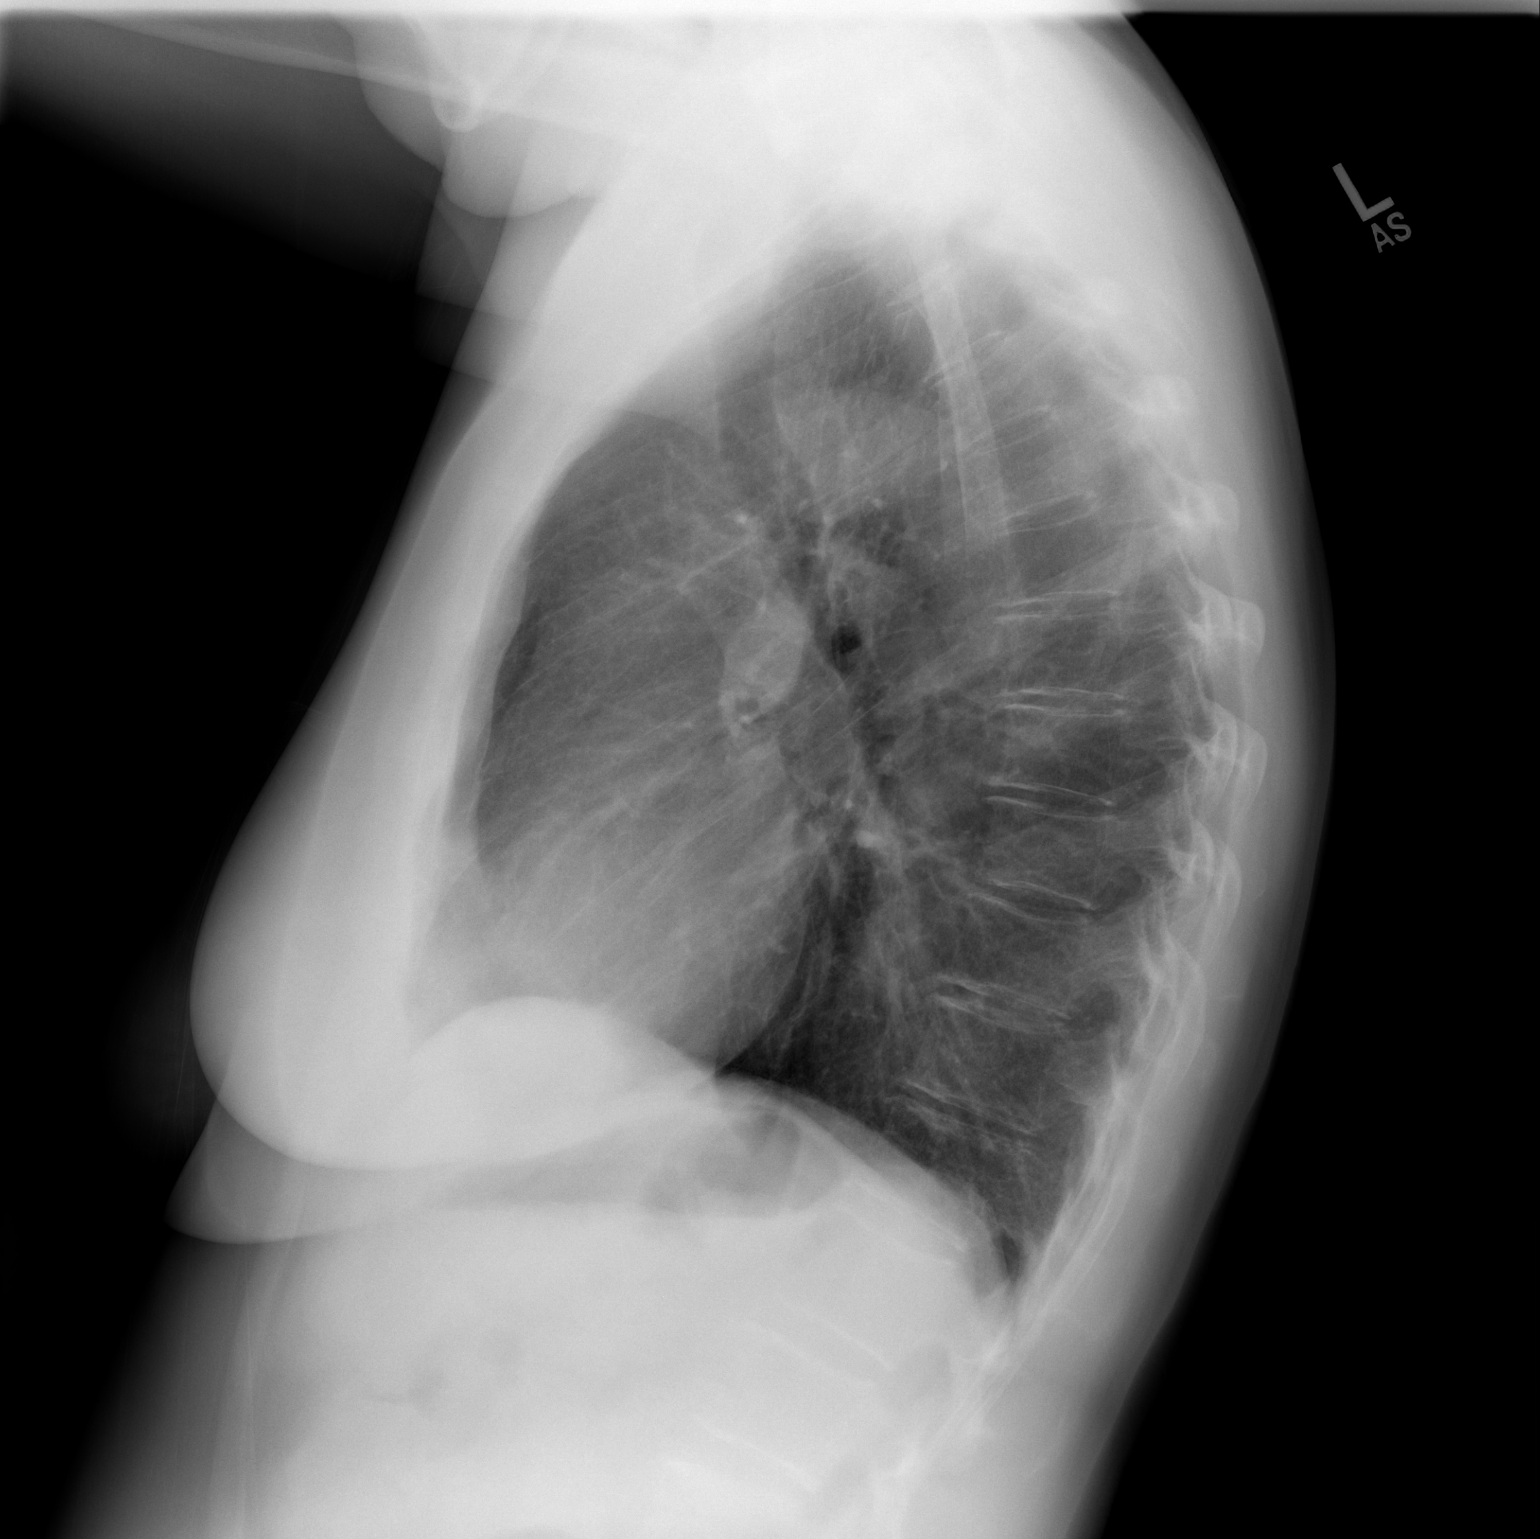

[2 of 2 positions shown; findings below may reference images not displayed]

FINDINGS: Mild biapical pleural thickening.  Lungs otherwise clear.
Heart is normal size.  No effusions.  No bony abnormality.
IMPRESSION: Mild biapical pleural thickening.

## 2013-11-08 ENCOUNTER — Encounter: Payer: Self-pay | Admitting: *Deleted

## 2013-11-11 ENCOUNTER — Other Ambulatory Visit (HOSPITAL_BASED_OUTPATIENT_CLINIC_OR_DEPARTMENT_OTHER): Payer: BC Managed Care – PPO

## 2013-11-11 ENCOUNTER — Encounter: Payer: Self-pay | Admitting: Adult Health

## 2013-11-11 ENCOUNTER — Ambulatory Visit (HOSPITAL_BASED_OUTPATIENT_CLINIC_OR_DEPARTMENT_OTHER): Payer: BC Managed Care – PPO | Admitting: Adult Health

## 2013-11-11 ENCOUNTER — Ambulatory Visit (INDEPENDENT_AMBULATORY_CARE_PROVIDER_SITE_OTHER): Payer: BC Managed Care – PPO | Admitting: Nurse Practitioner

## 2013-11-11 ENCOUNTER — Encounter: Payer: Self-pay | Admitting: Nurse Practitioner

## 2013-11-11 ENCOUNTER — Telehealth: Payer: Self-pay | Admitting: *Deleted

## 2013-11-11 ENCOUNTER — Ambulatory Visit
Admission: RE | Admit: 2013-11-11 | Discharge: 2013-11-11 | Disposition: A | Discharge status: Home / Self Care | Payer: BC Managed Care – PPO | Source: Ambulatory Visit | Attending: Nurse Practitioner | Admitting: Nurse Practitioner

## 2013-11-11 VITALS — BP 123/79 | HR 71 | Temp 98.4°F | Resp 18 | Ht 66.0 in | Wt 174.2 lb

## 2013-11-11 VITALS — BP 122/84 | HR 64 | Ht 66.0 in | Wt 172.0 lb

## 2013-11-11 DIAGNOSIS — Z0000 Encounter for general adult medical examination without abnormal findings: Secondary | ICD-10-CM

## 2013-11-11 DIAGNOSIS — C50219 Malignant neoplasm of upper-inner quadrant of unspecified female breast: Secondary | ICD-10-CM

## 2013-11-11 DIAGNOSIS — E559 Vitamin D deficiency, unspecified: Secondary | ICD-10-CM

## 2013-11-11 DIAGNOSIS — F329 Major depressive disorder, single episode, unspecified: Secondary | ICD-10-CM | POA: Insufficient documentation

## 2013-11-11 DIAGNOSIS — F3289 Other specified depressive episodes: Secondary | ICD-10-CM

## 2013-11-11 DIAGNOSIS — C50211 Malignant neoplasm of upper-inner quadrant of right female breast: Secondary | ICD-10-CM

## 2013-11-11 DIAGNOSIS — Z853 Personal history of malignant neoplasm of breast: Secondary | ICD-10-CM

## 2013-11-11 DIAGNOSIS — Z170 Estrogen receptor positive status [ER+]: Secondary | ICD-10-CM

## 2013-11-11 DIAGNOSIS — Z01419 Encounter for gynecological examination (general) (routine) without abnormal findings: Secondary | ICD-10-CM

## 2013-11-11 DIAGNOSIS — C50319 Malignant neoplasm of lower-inner quadrant of unspecified female breast: Secondary | ICD-10-CM

## 2013-11-11 DIAGNOSIS — F32A Depression, unspecified: Secondary | ICD-10-CM | POA: Insufficient documentation

## 2013-11-11 MED ORDER — BUPROPION HCL ER (XL) 150 MG PO TB24: ORAL_TABLET | ORAL | Status: DC

## 2013-11-11 MED ORDER — ERGOCALCIFEROL 1.25 MG (50000 UT) PO CAPS: 50000.0000 [IU] | ORAL_CAPSULE | ORAL | Status: DC

## 2013-11-11 NOTE — Telephone Encounter (Signed)
appts made and printed...td 

## 2013-11-11 NOTE — Patient Instructions (Signed)

## 2013-11-11 NOTE — Progress Notes (Signed)
OFFICE PROGRESS NOTE  CC Dr. Alphonsa Overall Dr. Thea Silversmith Dr. Elyse Hsu  DIAGNOSIS: 63 year old female who was originally seen in the multidisciplinary breast clinic for new diagnosis of T1 C. Invasive ductal carcinoma of the right breast.  PRIOR THERAPY:  #1 patient is now status post lumpectomy with sentinel node biopsy that revealed a 2.0 cm invasive ductal carcinoma one sentinel node was negative for metastatic disease tumor was ER +100% PR +60% Ki-67 19% and HER-2/neu negative.  #2 patient does have a significant family history of ovarian cancer and patient originally was seen by Roma Kayser but at that time patient declined genetic testing. She is now open to this possibility and we will get her back to the genetic counselor.  #3 patient is now status post radiation therapy administered by Dr. Pablo Ledger. She completed her radiation March 20, 2012.   #4 patient began Arimidex 1 mg daily in August, 2013.  A total of 5 years of therapy is planned.  CURRENT THERAPY: Arimidex 1 mg daily starting 04/16/2012.  INTERVAL HISTORY: Cindy Skinner 63 y.o. female returns for evaluation for her h/o invasive ductal carcinoma of her right breast.  She is doing well today.  She has quit smoking.  She continues to take Arimidex daily and denies hot flashes, joint aches, or dryness.  She denies fever, night sweats, pain, unintentional weight loss, or any further concerns.  A 10 point ROS is negative.    MEDICAL HISTORY: Past Medical History  Diagnosis Date  . Wears dentures     upper denture  . Arthritis     knees  . Breast cancer 12/2011    right breast  . S/P radiation therapy 02/16/12 - 03/20/12    Right Breast  . Use of anastrozole (Arimidex) 04/16/12  . Hyperlipidemia 2014  . Osteopenia 12/08/2011    of spine and hip  . Vitamin D deficiency disease   . Former smoker quit 12/2011    ALLERGIES:  is allergic to percocet and vicodin.  MEDICATIONS:  Current Outpatient  Prescriptions  Medication Sig Dispense Refill  . anastrozole (ARIMIDEX) 1 MG tablet TAKE 1 TABLET BY MOUTH ONCE DAILY  90 tablet  0  . atorvastatin (LIPITOR) 10 MG tablet Take 10 mg by mouth daily.      Marland Kitchen buPROPion (WELLBUTRIN XL) 150 MG 24 hr tablet TAKE 1 TABLET BY MOUTH EVERY DAY  90 tablet  3  . calcium-vitamin D (OSCAL WITH D) 500-200 MG-UNIT per tablet Take 2 tablets by mouth daily. PM      . ergocalciferol (VITAMIN D2) 50000 UNITS capsule Take 1 capsule (50,000 Units total) by mouth once a week. PM  30 capsule  3  . meloxicam (MOBIC) 15 MG tablet Take 15 mg by mouth daily.      . methimazole (TAPAZOLE) 5 MG tablet Take 5 mg by mouth daily.       No current facility-administered medications for this visit.    SURGICAL HISTORY:  Past Surgical History  Procedure Laterality Date  . Ovarian cyst removal Right 1991  . Anterior cervical discectomy  06/12/2006    and fusion C6-7  . Hysteroscopy w/d&c  12/13/2005    for uterine polyps  . Mastectomy partial / lumpectomy w/ axillary lymphadenectomy  01/20/2012    right breast lumpectomy with sentinel lymph node biopsy. T1N0, ER/PR + Her 2/neu negative, radiation X 29 treatments.  . Appendectomy  1991  . Breast lumpectomy Right 1979    benign  . Breast  biopsy Right 01/02/2012    Needle core biopsy - invasive ductal cancer    REVIEW OF SYSTEMS:  A 10 point review of systems was conducted and is otherwise negative except for what is noted above.    Health Maintenance Mammogram: 11/11/2013 Colonoscopy: May 2014, 5 year f/u recommended Bone Density Scan: 11/2011 Pap Smear: 11/11/2013 Eye Exam: Due in April, 2015 Vitamin D Level: by gyn Lipid Panel: 09/2013   PHYSICAL EXAMINATION: BP 123/79  Pulse 71  Temp(Src) 98.4 F (36.9 C) (Oral)  Resp 18  Ht _0  (1.676 m)  Wt 174 lb 3.2 oz (79.017 kg)  BMI 28.13 kg/m2  LMP 06/29/2006 GENERAL: Patient is a well appearing female in no acute distress HEENT:  Sclerae anicteric.  Oropharynx clear  and moist. No ulcerations or evidence of oropharyngeal candidiasis. Neck is supple.  NODES:  No cervical, supraclavicular, or axillary lymphadenopathy palpated.  BREAST EXAM: right breast lumpectomy scars without nodularity, no masses, nodules or lesions, left breast without masses nodules, lesions or skin changes.  Benign breast exam.  LUNGS:  Clear to auscultation bilaterally.  No wheezes or rhonchi. HEART:  Regular rate and rhythm. No murmur appreciated. ABDOMEN:  Soft, nontender.  Positive, normoactive bowel sounds. No organomegaly palpated. MSK:  No focal spinal tenderness to palpation. Full range of motion bilaterally in the upper extremities. EXTREMITIES:  No peripheral edema.   SKIN:  Clear with no obvious rashes or skin changes. No nail dyscrasia. NEURO:  Nonfocal. Well oriented.  Appropriate affect. ECOG PERFORMANCE STATUS: 0 - Asymptomatic  LABORATORY DATA: Lab Results  Component Value Date   WBC 4.5 07/18/2012   HGB 13.7 07/18/2012   HCT 39.4 07/18/2012   MCV 96.5 07/18/2012   PLT 236 07/18/2012      Chemistry      Component Value Date/Time   NA 143 07/18/2012 0829   NA 142 01/11/2012 0805   K 4.2 07/18/2012 0829   K 3.7 01/11/2012 0805   CL 108* 07/18/2012 0829   CL 103 01/11/2012 0805   CO2 28 07/18/2012 0829   CO2 30 01/11/2012 0805   BUN 17.0 07/18/2012 0829   BUN 16 01/11/2012 0805   CREATININE 0.8 07/18/2012 0829   CREATININE 0.87 01/11/2012 0805      Component Value Date/Time   CALCIUM 10.0 07/18/2012 0829   CALCIUM 9.7 01/11/2012 0805   ALKPHOS 106 07/18/2012 0829   ALKPHOS 86 01/11/2012 0805   AST 29 07/18/2012 0829   AST 20 01/11/2012 0805   ALT 33 07/18/2012 0829   ALT 23 01/11/2012 0805   BILITOT 0.99 07/18/2012 0829   BILITOT 0.7 01/11/2012 0805     ADDITIONAL INFORMATION: 2. CHROMOGENIC IN-SITU HYBRIDIZATION Interpretation HER-2/NEU BY CISH - NO AMPLIFICATION OF HER-2 DETECTED. THE RATIO OF HER-2: CEP 17 SIGNALS WAS 1.20. Reference range: Ratio:  HER2:CEP17 < 1.8 - gene amplification not observed Ratio: HER2:CEP 17 1.8-2.2 - equivocal result Ratio: HER2:CEP17 > 2.2 - gene amplification observed Enid Cutter MD Pathologist, Electronic Signature ( Signed 02/01/2012) FINAL DIAGNOSIS Diagnosis 1. Lymph node, sentinel, biopsy, Right axillary - ONE BENIGN LYMPH NODE WITH ASSOCIATED PIGMENT. - NO EVIDENCE OF METASTATIC CARCINOMA (0/1). 2. Breast, lumpectomy, Right - INVASIVE DUCTAL CARCINOMA, 2.0 CM, NOTTINGHAM COMBINED HISTOLOGIC GRADE II - NO EVIDENCE OF ANGIOLYMPHATIC INVASION IDENTIFIED. - PLEASE SEE ONCOLOGY TEMPLATE FOR DETAILS. 3. Breast, excision, Right superior margin - BENIGN BREAST TISSUE, NO ATYPIA OR MALIGNANCY. - INKED CAUTERIZED ADDITIONAL SUPERIOR MARGIN, NEGATIVE FOR ATYPIA OR MALIGNANCY. 1 of 3  FINAL for CHRISA, HASSAN ([id number]) Microscopic Comment 2. BREAST, INVASIVE TUMOR, WITH LYMPH NODE SAMPLING Specimen, including laterality: Right breast Procedure: Lumpectomy Grade: II Tubule formation: 3 Nuclear pleomorphism: 2 Mitotic: 1 Tumor size (gross measurement): 2.0 cm Margins: Negative Invasive, distance to closest margin: Posterior/deep margin - 0.2 cm In-situ, distance to closest margin: N/A Lymphovascular invasion: Not identified Ductal carcinoma in situ: Not identified Lobular neoplasia: N/A Tumor focality: Unifocal Treatment effect: No Extent of tumor: Skin: No Nipple: N/A Skeletal muscle: N/A Lymph nodes: # examined: 1 Lymph nodes with metastasis: 0 Isolated tumor cells (< 0.2 mm): 0 Micrometastasis: (> 0.2 mm and < 2.0 mm): 0 Macrometastasis: (> 2.0 mm): 0 Extracapsular extension: 0 Breast prognostic profile: Please correlate with previous case [phone] Estrogen receptor: 100%, positive, strong staining intensity Progesterone receptor: 16%, positive, strong staining intensity Her 2 neu: No amplification of Her 2 detected. The ratio of Her 2:CEP 17 signals was 1.73. Her 2 neu will be  repeated an addendum will follow. Ki-67: 19% Non-neoplastic breast: Unremarkable. TNM: pT1c, pN0 (sn-) Aldona Bar MD Pathologist, Electronic Signature (Case signed 01/24/2012)  RADIOGRAPHIC STUDIES:  01/20/2012  *RADIOLOGY REPORT*  Clinical Data:  Recently diagnosed right breast invasive ductal carcinoma.  NEEDLE LOCALIZATION USING ULTRASOUND GUIDANCE AND SPECIMEN RADIOGRAPH  The patient presents for needle localization prior to right lumpectomy.  I met with the patient and we discussed the procedure of needle localization including risks.  Specifically, we discussed the risks of bleeding and infection.  Informed written consent was given.  Using ultrasound guidance, sterile technique, local anesthesia and an ultrasound localizing needle, the recently biopsied invasive ductal carcinoma in the 1 o'clock position of the right breast, 12 cm from the nipple, was localized using a lateral approach.  Images from the ultrasound localization and a post localization mammogram were printed and sent to the operating room for Dr. Lucia Gaskins.  Specimen radiography performed at [facility] demonstrates the wire tip, mass and biopsy marker clip present in the tissue sample.  The specimen was marked for pathology.  IMPRESSION: Needle localization right breast.  No apparent complications.  Original Report Authenticated By: Gerald Stabs, M.D.   Mm Breast Surgical Specimen  01/20/2012  *RADIOLOGY REPORT*  Clinical Data:  Recently diagnosed right breast invasive ductal carcinoma.  NEEDLE LOCALIZATION USING ULTRASOUND GUIDANCE AND SPECIMEN RADIOGRAPH  The patient presents for needle localization prior to right lumpectomy.  I met with the patient and we discussed the procedure of needle localization including risks.  Specifically, we discussed the risks of bleeding and infection.  Informed written consent was given.  Using ultrasound guidance, sterile technique, local anesthesia and an ultrasound localizing needle, the  recently biopsied invasive ductal carcinoma in the 1 o'clock position of the right breast, 12 cm from the nipple, was localized using a lateral approach.  Images from the ultrasound localization and a post localization mammogram were printed and sent to the operating room for Dr. Lucia Gaskins.  Specimen radiography performed at [facility] demonstrates the wire tip, mass and biopsy marker clip present in the tissue sample.  The specimen was marked for pathology.  IMPRESSION: Needle localization right breast.  No apparent complications.  Original Report Authenticated By: Gerald Stabs, M.D.    ASSESSMENT: 63 year old female with  #1 T1 C. N0 invasive ductal carcinoma of the right breast status post lumpectomy with the final pathology revealing 2.0 cm ER positive PR positive HER-2/neu negative breast cancer with Ki-67 at 19%. Patient is has completed radiation therapy  and she subsequently began Arimidex 1 mg daily. Overall she is tolerating it well. She has no evidence of recurrent disease.  PLAN:  #1 patient is doing well.  She has no sign of recurrence, she will continue to take Arimidex every day.    # 2 We updated her health maintenance above.  Her PCP will order her bone density as it is due soon.  I counseled her on survivorship, keeping up to date on her health maintenance, diet, exercise, and self breast exams.   #3 She will return in one year for f/u.  I recommended 6 month f/u, but the patient declined, and was adamant to f/u in one year.   All questions were answered. The patient knows to call the clinic with any problems, questions or concerns. We can certainly see the patient much sooner if necessary.  I spent 25 minutes counseling the patient face to face. The total time spent in the appointment was 30 minutes.  Minette Headland, [place] [phone] 11/11/2013, 3:13 PM

## 2013-11-11 NOTE — Progress Notes (Signed)
Patient ID: Cindy Skinner, female   DOB: 05/09/1951, 63 y.o.   MRN: [id number] 63 y.o. G0P0 Single Caucasian Fe here for annual exam. [place] now [place] in [place], [place].  She has 3 sisters who live there but maintains her MD's here. She feels well.  Still has breast tenderness at lumpectomy site from her surgery 2 years ago. She had a brother who died of pancreatic cancer this past year,  Patient's last menstrual period was 06/29/2006.          Sexually active: no  The current method of family planning is none.    Exercising: no  The patient does not participate in regular exercise at present. Smoker:  no  Health Maintenance: Pap:  11/08/11, WNL, neg HR HPV MMG:  12/10/12, Bi-Rads 2: benign findings; has MMG scheduled for today Colonoscopy:  01/14/13, tubular adenoma BMD:   12/08/11, Osteopenia  T Score: spine: -1.8/ hip -1.5/ -1.9 TDaP:  12/2005 Labs: PCP, brought copies of labs - will scan   reports that she quit smoking about 21 months ago. Her smoking use included Cigarettes. She has a 67.5 pack-year smoking history. She has never used smokeless tobacco. She reports that she does not drink alcohol or use illicit drugs.  Past Medical History  Diagnosis Date  . Wears dentures     upper denture  . Arthritis     knees  . Breast cancer 12/2011    right breast  . S/P radiation therapy 02/16/12 - 03/20/12    Right Breast  . Use of anastrozole (Arimidex) 04/16/12  . Hyperlipidemia 2014  . Osteopenia 12/08/2011    of spine and hip  . Vitamin D deficiency disease   . Former smoker quit 12/2011    Past Surgical History  Procedure Laterality Date  . Ovarian cyst removal Right 1991  . Anterior cervical discectomy  06/12/2006    and fusion C6-7  . Hysteroscopy w/d&c  12/13/2005    for uterine polyps  . Mastectomy partial / lumpectomy w/ axillary lymphadenectomy  01/20/2012    right breast lumpectomy with sentinel lymph node biopsy. T1N0, ER/PR + Her 2/neu negative, radiation  X 29 treatments.  . Appendectomy  1991  . Breast lumpectomy Right 1979    benign  . Breast biopsy Right 01/02/2012    Needle core biopsy - invasive ductal cancer    Current Outpatient Prescriptions  Medication Sig Dispense Refill  . anastrozole (ARIMIDEX) 1 MG tablet TAKE 1 TABLET BY MOUTH ONCE DAILY  90 tablet  0  . atorvastatin (LIPITOR) 10 MG tablet Take 10 mg by mouth daily.      Marland Kitchen buPROPion (WELLBUTRIN XL) 150 MG 24 hr tablet TAKE 1 TABLET BY MOUTH EVERY DAY  90 tablet  3  . calcium-vitamin D (OSCAL WITH D) 500-200 MG-UNIT per tablet Take 2 tablets by mouth daily. PM      . ergocalciferol (VITAMIN D2) 50000 UNITS capsule Take 1 capsule (50,000 Units total) by mouth once a week. PM  30 capsule  3  . meloxicam (MOBIC) 15 MG tablet Take 15 mg by mouth daily.      . methimazole (TAPAZOLE) 5 MG tablet Take 5 mg by mouth daily.       No current facility-administered medications for this visit.    Family History  Problem Relation Age of Onset  . Ovarian cancer Sister   . Uterine cancer Sister   . Ovarian cancer Sister 15    ovarian  and uterine  . Lung cancer Maternal Aunt   . Lung cancer Maternal Aunt     lung  . Uterine cancer Sister 30    uterine  . Pancreatic cancer Brother 48  . Bladder Cancer Sister 58    ROS:  Pertinent items are noted in HPI.  Otherwise, a comprehensive ROS was negative.  Exam:   BP 122/84  Pulse 64  Ht 5\' 6"  (1.676 m)  Wt 172 lb (78.019 kg)  BMI 27.77 kg/m2  LMP 06/29/2006 Height: 5\' 6"  (167.6 cm)  Ht Readings from Last 3 Encounters:  11/11/13 5\' 6"  (1.676 m)  07/18/12 5\' 6"  (1.676 m)  04/16/12 5\' 6"  (1.676 m)    General appearance: alert, cooperative and appears stated age Head: Normocephalic, without obvious abnormality, atraumatic Neck: no adenopathy, supple, symmetrical, trachea midline and thyroid normal to inspection and palpation Lungs: clear to auscultation bilaterally Breasts: normal appearance, no masses or tenderness, positive  findings: on the left and surgical and radiation changes on the right at 12 -1 oclock position that remains tender to palpation. Heart: regular rate and rhythm Abdomen: soft, non-tender; no masses,  no organomegaly Extremities: extremities normal, atraumatic, no cyanosis or edema Skin: Skin color, texture, turgor normal. No rashes or lesions Lymph nodes: Cervical, supraclavicular, and axillary nodes normal. No abnormal inguinal nodes palpated Neurologic: Grossly normal   Pelvic: External genitalia:  no lesions              Urethra:  normal appearing urethra with no masses, tenderness or lesions              Bartholin's and Skene's: normal                 Vagina: normal appearing vagina with normal color and discharge, no lesions              Cervix: anteverted              Pap taken: no Bimanual Exam:  Uterus:  normal size, contour, position, consistency, mobility, non-tender              Adnexa: no mass, fullness, tenderness               Rectovaginal: Confirms               Anus:  normal sphincter tone, no lesions  A:  Well Woman with normal exam  Postmenopausal  S/P right breast lumpectomy secondary to cancer 01/20/2012  History of Vit D deficiency  Osteopenia   History of Depression - doing well on current treatment  P:   Pap smear as per guidelines Not done today but wants next year  Mammogram is being done today  Seeing Oncologist today  Refill Vit D and will follow with labs  Refill Wellbutrin XL 150 mg daily for a year  Counseled on breast self exam, mammography screening, adequate intake of calcium and vitamin D, diet and exercise, Kegel's exercises return annually or prn  An After Visit Summary was printed and given to the patient.

## 2013-11-12 LAB — VITAMIN D 25 HYDROXY (VIT D DEFICIENCY, FRACTURES): VIT D 25 HYDROXY: 60 ng/mL (ref 30–89)

## 2013-11-12 NOTE — Progress Notes (Signed)
Encounter reviewed by Dr. Loralyn Rachel Silva.  

## 2014-01-17 ENCOUNTER — Other Ambulatory Visit: Payer: Self-pay | Admitting: Nurse Practitioner

## 2014-08-12 ENCOUNTER — Telehealth: Payer: Self-pay | Admitting: Hematology and Oncology

## 2014-08-12 NOTE — Telephone Encounter (Signed)
, °

## 2014-08-18 ENCOUNTER — Other Ambulatory Visit: Payer: Self-pay | Admitting: Nurse Practitioner

## 2014-08-18 ENCOUNTER — Telehealth: Payer: Self-pay | Admitting: Hematology and Oncology

## 2014-08-18 DIAGNOSIS — Z853 Personal history of malignant neoplasm of breast: Secondary | ICD-10-CM

## 2014-08-18 DIAGNOSIS — Z9889 Other specified postprocedural states: Secondary | ICD-10-CM

## 2014-08-18 NOTE — Telephone Encounter (Signed)
Pt called and canceled appt and did not want to resch she says she lives out of town and did not want multiple trips outside of other appts. No avail w/MD on Vac in March.

## 2014-11-14 ENCOUNTER — Other Ambulatory Visit: Payer: Self-pay | Admitting: Nurse Practitioner

## 2014-11-17 NOTE — Telephone Encounter (Signed)
Medication refill request: wellbutrin  Last AEX:  11/11/13 PG Next AEX: 11/24/14 PG Last MMG (if hormonal medication request): 11/11/13 BIRADS2:Benign  Refill authorized: 11/11/13 #90/3R. Today #30/0R?

## 2014-11-24 ENCOUNTER — Encounter: Payer: Self-pay | Admitting: Nurse Practitioner

## 2014-11-24 ENCOUNTER — Other Ambulatory Visit: Payer: BC Managed Care – PPO

## 2014-11-24 ENCOUNTER — Ambulatory Visit: Payer: BC Managed Care – PPO | Admitting: Oncology

## 2014-11-24 ENCOUNTER — Ambulatory Visit
Admission: RE | Admit: 2014-11-24 | Discharge: 2014-11-24 | Disposition: A | Discharge status: Home / Self Care | Payer: BLUE CROSS/BLUE SHIELD | Source: Ambulatory Visit | Attending: Nurse Practitioner | Admitting: Nurse Practitioner

## 2014-11-24 ENCOUNTER — Ambulatory Visit (INDEPENDENT_AMBULATORY_CARE_PROVIDER_SITE_OTHER): Payer: BLUE CROSS/BLUE SHIELD | Admitting: Nurse Practitioner

## 2014-11-24 VITALS — BP 122/80 | HR 76 | Resp 16 | Ht 66.25 in | Wt 180.0 lb

## 2014-11-24 DIAGNOSIS — Z01419 Encounter for gynecological examination (general) (routine) without abnormal findings: Secondary | ICD-10-CM

## 2014-11-24 DIAGNOSIS — Z853 Personal history of malignant neoplasm of breast: Secondary | ICD-10-CM

## 2014-11-24 DIAGNOSIS — Z9889 Other specified postprocedural states: Secondary | ICD-10-CM

## 2014-11-24 DIAGNOSIS — Z0000 Encounter for general adult medical examination without abnormal findings: Secondary | ICD-10-CM | POA: Diagnosis not present

## 2014-11-24 MED ORDER — BUPROPION HCL ER (XL) 150 MG PO TB24: 150.0000 mg | ORAL_TABLET | Freq: Every day | ORAL | Status: DC

## 2014-11-24 MED ORDER — VITAMIN D (ERGOCALCIFEROL) 1.25 MG (50000 UNIT) PO CAPS: 50000.0000 [IU] | ORAL_CAPSULE | ORAL | Status: DC

## 2014-11-24 NOTE — Progress Notes (Signed)
64 y.o. G0P0 Single  Caucasian Fe here for annual exam.  recent fall and fracture of left wrist in October.  Will have left knee scope next week with of repair of meniscus.  Recent labs with PCP and Vit D was down - ask that we give RX.  Will get a copy of labs.  She has apt today for Mammo and oncology apt.  Patient's last menstrual period was 06/29/2006.          Sexually active: No.  The current method of family planning is abstinence and post menopausal status.    Exercising: No.  The patient does not participate in regular exercise at present. Smoker:  Former  Health Maintenance: Pap:  10/2011 Neg. HR HPV:Neg MMG: Diagnostic Bilateral 11/11/13 BIRADS2:Benign  Colonoscopy: 2014 polyps - repeat every 5 years  BMD:   12/08/11 T Score: Spine:-1.8, Left hip -1.5, right -1.9 FRAX: 9.3 / 1.1 TDaP:  2007 Labs: PCP   reports that she quit smoking about 2 years ago. Her smoking use included Cigarettes. She has a 67.5 pack-year smoking history. She has never used smokeless tobacco. She reports that she does not drink alcohol or use illicit drugs.  Past Medical History  Diagnosis Date  . Wears dentures     upper denture  . Arthritis     knees  . Breast cancer 12/2011    right breast  . S/P radiation therapy 02/16/12 - 03/20/12    Right Breast  . Use of anastrozole (Arimidex) 04/16/12  . Hyperlipidemia 2014  . Osteopenia 12/08/2011    of spine and hip  . Vitamin D deficiency disease   . Former smoker quit 12/2011    Past Surgical History  Procedure Laterality Date  . Ovarian cyst removal Right 1991  . Anterior cervical discectomy  06/12/2006    and fusion C6-7  . Hysteroscopy w/d&c  12/13/2005    for uterine polyps  . Mastectomy partial / lumpectomy w/ axillary lymphadenectomy  01/20/2012    right breast lumpectomy with sentinel lymph node biopsy. T1N0, ER/PR + Her 2/neu negative, radiation X 29 treatments.  . Appendectomy  1991  . Breast lumpectomy Right 1979    benign  . Breast biopsy  Right 01/02/2012    Needle core biopsy - invasive ductal cancer  . Neck surgery  ~2006    Current Outpatient Prescriptions  Medication Sig Dispense Refill  . anastrozole (ARIMIDEX) 1 MG tablet TAKE 1 TABLET BY MOUTH ONCE DAILY 90 tablet 0  . atorvastatin (LIPITOR) 10 MG tablet Take 10 mg by mouth daily.    Marland Kitchen buPROPion (WELLBUTRIN XL) 150 MG 24 hr tablet Take 1 tablet (150 mg total) by mouth daily. 90 tablet 3  . calcium-vitamin D (OSCAL WITH D) 500-200 MG-UNIT per tablet Take 2 tablets by mouth daily. PM    . meloxicam (MOBIC) 15 MG tablet Take 15 mg by mouth daily.    . methimazole (TAPAZOLE) 5 MG tablet Take 5 mg by mouth daily.    . Vitamin D, Ergocalciferol, (DRISDOL) 50000 UNITS CAPS capsule Take 1 capsule (50,000 Units total) by mouth every 7 (seven) days. 30 capsule 3   No current facility-administered medications for this visit.    Family History  Problem Relation Age of Onset  . Ovarian cancer Sister   . Uterine cancer Sister   . Ovarian cancer Sister 80    ovarian and uterine  . Lung cancer Maternal Aunt   . Lung cancer Maternal Aunt  lung  . Uterine cancer Sister 30    uterine  . Pancreatic cancer Brother 56  . Bladder Cancer Sister 83    ROS:  Pertinent items are noted in HPI.  Otherwise, a comprehensive ROS was negative.  Exam:   BP 122/80 mmHg  Pulse 76  Resp 16  Ht 5' 6.25" (1.683 m)  Wt 180 lb (81.647 kg)  BMI 28.83 kg/m2  LMP 06/29/2006 Height: 5' 6.25" (168.3 cm) Ht Readings from Last 3 Encounters:  11/24/14 5' 6.25" (1.683 m)  11/11/13 5\' 6"  (1.676 m)  11/11/13 5\' 6"  (1.676 m)    General appearance: alert, cooperative and appears stated age Head: Normocephalic, without obvious abnormality, atraumatic Neck: no adenopathy, supple, symmetrical, trachea midline and thyroid normal to inspection and palpation Lungs: clear to auscultation bilaterally Breasts: normal appearance, no masses or tenderness, right breast with surgical and radiaiton  changes Heart: regular rate and rhythm Abdomen: soft, non-tender; no masses,  no organomegaly Extremities: extremities normal, atraumatic, no cyanosis or edema Skin: Skin color, texture, turgor normal. No rashes or lesions Lymph nodes: Cervical, supraclavicular, and axillary nodes normal. No abnormal inguinal nodes palpated Neurologic: Grossly normal   Pelvic: External genitalia:  no lesions              Urethra:  normal appearing urethra with no masses, tenderness or lesions              Bartholin's and Skene's: normal                 Vagina: normal appearing vagina with normal color and discharge, no lesions              Cervix: anteverted              Pap taken: Yes.   Bimanual Exam:  Uterus:  normal size, contour, position, consistency, mobility, non-tender              Adnexa: no mass, fullness, tenderness               Rectovaginal: Confirms               Anus:  normal sphincter tone, no lesions  Chaperone present: Yes  A:  Well Woman with normal exam  Postmenopausal S/P right breast lumpectomy secondary to cancer 01/20/2012 History of Vit D deficiency Osteopenia - stable History of Depression - doing well on current treatment    P:   Reviewed health and wellness pertinent to exam  Pap smear taken today  Mammogram is today  Refill on Vit D 50,000 IU weekly  Refill on Wellbutrin 150 mg daily for a year  Counseled on breast self exam, mammography screening, adequate intake of calcium and vitamin D, diet and exercise, Kegel's exercises return annually or prn  An After Visit Summary was printed and given to the patient.

## 2014-11-24 NOTE — Patient Instructions (Addendum)

## 2014-11-25 LAB — IPS PAP TEST WITH HPV

## 2014-11-26 NOTE — Progress Notes (Signed)
Encounter reviewed by Dr. Shaka Cardin Silva.  

## 2014-11-27 ENCOUNTER — Other Ambulatory Visit: Payer: Self-pay | Admitting: Oncology

## 2014-12-04 ENCOUNTER — Other Ambulatory Visit: Payer: BC Managed Care – PPO

## 2014-12-04 ENCOUNTER — Ambulatory Visit: Payer: BC Managed Care – PPO | Admitting: Hematology and Oncology

## 2014-12-10 ENCOUNTER — Other Ambulatory Visit: Payer: Self-pay | Admitting: Nurse Practitioner

## 2014-12-10 DIAGNOSIS — Z853 Personal history of malignant neoplasm of breast: Secondary | ICD-10-CM

## 2014-12-18 ENCOUNTER — Other Ambulatory Visit: Payer: Self-pay | Admitting: Nurse Practitioner

## 2014-12-18 NOTE — Telephone Encounter (Signed)
11/24/14 #90/3 rfs, was sent to [facility].

## 2014-12-28 ENCOUNTER — Other Ambulatory Visit: Payer: Self-pay | Admitting: Oncology

## 2014-12-29 ENCOUNTER — Other Ambulatory Visit: Payer: Self-pay

## 2014-12-29 MED ORDER — ANASTROZOLE 1 MG PO TABS: 1.0000 mg | ORAL_TABLET | Freq: Every day | ORAL | Status: DC

## 2015-01-26 ENCOUNTER — Other Ambulatory Visit: Payer: Self-pay | Admitting: Hematology and Oncology

## 2015-01-27 NOTE — Telephone Encounter (Signed)
Refused - no fu visit scheduled.  1+ yr since seen.

## 2015-07-14 ENCOUNTER — Telehealth: Payer: Self-pay | Admitting: Hematology and Oncology

## 2015-07-14 NOTE — Telephone Encounter (Signed)
Spoke with patient as she wishes to reschedule her ,issed appointments to 12/01/15 as she lives out of town to go along with all other appointments

## 2015-11-26 ENCOUNTER — Ambulatory Visit: Payer: BLUE CROSS/BLUE SHIELD | Admitting: Nurse Practitioner

## 2015-11-27 ENCOUNTER — Ambulatory Visit: Payer: BLUE CROSS/BLUE SHIELD | Admitting: Nurse Practitioner

## 2015-11-30 ENCOUNTER — Other Ambulatory Visit: Payer: Self-pay

## 2015-11-30 DIAGNOSIS — C50211 Malignant neoplasm of upper-inner quadrant of right female breast: Secondary | ICD-10-CM

## 2015-11-30 DIAGNOSIS — C50311 Malignant neoplasm of lower-inner quadrant of right female breast: Secondary | ICD-10-CM

## 2015-12-01 ENCOUNTER — Other Ambulatory Visit (HOSPITAL_BASED_OUTPATIENT_CLINIC_OR_DEPARTMENT_OTHER): Payer: Commercial Managed Care - HMO

## 2015-12-01 ENCOUNTER — Encounter: Payer: Self-pay | Admitting: Hematology and Oncology

## 2015-12-01 ENCOUNTER — Ambulatory Visit
Admission: RE | Admit: 2015-12-01 | Discharge: 2015-12-01 | Disposition: A | Discharge status: Home / Self Care | Payer: Commercial Managed Care - HMO | Source: Ambulatory Visit | Attending: Nurse Practitioner | Admitting: Nurse Practitioner

## 2015-12-01 ENCOUNTER — Other Ambulatory Visit: Payer: Self-pay

## 2015-12-01 ENCOUNTER — Encounter: Payer: Self-pay | Admitting: Nurse Practitioner

## 2015-12-01 ENCOUNTER — Ambulatory Visit (HOSPITAL_BASED_OUTPATIENT_CLINIC_OR_DEPARTMENT_OTHER): Payer: Commercial Managed Care - HMO | Admitting: Hematology and Oncology

## 2015-12-01 ENCOUNTER — Ambulatory Visit (INDEPENDENT_AMBULATORY_CARE_PROVIDER_SITE_OTHER): Payer: Commercial Managed Care - HMO | Admitting: Nurse Practitioner

## 2015-12-01 ENCOUNTER — Telehealth: Payer: Self-pay | Admitting: Hematology and Oncology

## 2015-12-01 VITALS — BP 97/67 | HR 84 | Temp 98.0°F | Resp 18 | Ht 66.25 in | Wt 185.2 lb

## 2015-12-01 VITALS — BP 110/80 | HR 84 | Resp 18 | Ht 65.5 in | Wt 183.0 lb

## 2015-12-01 DIAGNOSIS — C50311 Malignant neoplasm of lower-inner quadrant of right female breast: Secondary | ICD-10-CM

## 2015-12-01 DIAGNOSIS — Z8041 Family history of malignant neoplasm of ovary: Secondary | ICD-10-CM

## 2015-12-01 DIAGNOSIS — E559 Vitamin D deficiency, unspecified: Secondary | ICD-10-CM | POA: Diagnosis not present

## 2015-12-01 DIAGNOSIS — Z0000 Encounter for general adult medical examination without abnormal findings: Secondary | ICD-10-CM

## 2015-12-01 DIAGNOSIS — Z1231 Encounter for screening mammogram for malignant neoplasm of breast: Secondary | ICD-10-CM

## 2015-12-01 DIAGNOSIS — M8580 Other specified disorders of bone density and structure, unspecified site: Secondary | ICD-10-CM

## 2015-12-01 DIAGNOSIS — Z853 Personal history of malignant neoplasm of breast: Secondary | ICD-10-CM

## 2015-12-01 DIAGNOSIS — C50211 Malignant neoplasm of upper-inner quadrant of right female breast: Secondary | ICD-10-CM

## 2015-12-01 DIAGNOSIS — Z170 Estrogen receptor positive status [ER+]: Secondary | ICD-10-CM

## 2015-12-01 DIAGNOSIS — Z79811 Long term (current) use of aromatase inhibitors: Secondary | ICD-10-CM | POA: Diagnosis not present

## 2015-12-01 DIAGNOSIS — Z01419 Encounter for gynecological examination (general) (routine) without abnormal findings: Secondary | ICD-10-CM

## 2015-12-01 LAB — CBC WITH DIFFERENTIAL/PLATELET
BASO%: 0.5 % (ref 0.0–2.0)
Basophils Absolute: 0 10*3/uL (ref 0.0–0.1)
EOS%: 2.5 % (ref 0.0–7.0)
Eosinophils Absolute: 0.1 10*3/uL (ref 0.0–0.5)
HCT: 43.6 % (ref 34.8–46.6)
HEMOGLOBIN: 14.6 g/dL (ref 11.6–15.9)
LYMPH#: 1.4 10*3/uL (ref 0.9–3.3)
LYMPH%: 32.6 % (ref 14.0–49.7)
MCH: 31.6 pg (ref 25.1–34.0)
MCHC: 33.5 g/dL (ref 31.5–36.0)
MCV: 94.4 fL (ref 79.5–101.0)
MONO#: 0.4 10*3/uL (ref 0.1–0.9)
MONO%: 8.3 % (ref 0.0–14.0)
NEUT#: 2.4 10*3/uL (ref 1.5–6.5)
NEUT%: 56.1 % (ref 38.4–76.8)
Platelets: 208 10*3/uL (ref 145–400)
RBC: 4.62 10*6/uL (ref 3.70–5.45)
RDW: 12.9 % (ref 11.2–14.5)
WBC: 4.3 10*3/uL (ref 3.9–10.3)

## 2015-12-01 LAB — COMPREHENSIVE METABOLIC PANEL
ALBUMIN: 4.2 g/dL (ref 3.5–5.0)
ALK PHOS: 102 U/L (ref 40–150)
ALT: 19 U/L (ref 0–55)
AST: 20 U/L (ref 5–34)
Anion Gap: 8 mEq/L (ref 3–11)
BILIRUBIN TOTAL: 1.39 mg/dL — AB (ref 0.20–1.20)
BUN: 20.4 mg/dL (ref 7.0–26.0)
CO2: 28 mEq/L (ref 22–29)
Calcium: 9.7 mg/dL (ref 8.4–10.4)
Chloride: 107 mEq/L (ref 98–109)
Creatinine: 1.1 mg/dL (ref 0.6–1.1)
EGFR: 55 mL/min/{1.73_m2} — AB (ref >90)
Glucose: 110 mg/dl (ref 70–140)
Potassium: 4.3 mEq/L (ref 3.5–5.1)
Sodium: 144 mEq/L (ref 136–145)
TOTAL PROTEIN: 7.7 g/dL (ref 6.4–8.3)

## 2015-12-01 LAB — POCT URINALYSIS DIPSTICK
Bilirubin, UA: NEGATIVE
Blood, UA: NEGATIVE
GLUCOSE UA: NEGATIVE
Ketones, UA: NEGATIVE
LEUKOCYTES UA: NEGATIVE
NITRITE UA: NEGATIVE
PROTEIN UA: NEGATIVE
UROBILINOGEN UA: NEGATIVE
pH, UA: 7

## 2015-12-01 MED ORDER — ANASTROZOLE 1 MG PO TABS: 1.0000 mg | ORAL_TABLET | Freq: Every day | ORAL | Status: AC

## 2015-12-01 MED ORDER — VITAMIN D (ERGOCALCIFEROL) 1.25 MG (50000 UNIT) PO CAPS: 50000.0000 [IU] | ORAL_CAPSULE | ORAL | Status: AC

## 2015-12-01 MED ORDER — BUPROPION HCL ER (XL) 150 MG PO TB24: 150.0000 mg | ORAL_TABLET | Freq: Every day | ORAL | Status: DC

## 2015-12-01 NOTE — Patient Instructions (Signed)

## 2015-12-01 NOTE — Progress Notes (Signed)
Unable to get in to exam room prior to MD.  No assessment performed.  

## 2015-12-01 NOTE — Assessment & Plan Note (Signed)
Right lumpectomy 01/20/2012: IDC, 2 cm, grade 2, 0/1 lymph node, margins clear, yellow 100%, PR 16%, HER-2 negative ratio 1.73, Ki-67 19%, T1 cN0 stage IA Declined genetic testing Status post radiation completed 03/20/2012  Current treatment: Arimidex 1 mg daily starting 04/16/2012  Arimidex toxicities: Denies any hot flashes or myalgias.  Breast Cancer Surveillance: 1. Breast exam 12/01/2015: Normal 2. Mammogram 11/24/2014: No abnormalities. Postsurgical changes. Breast Density Category C. I recommended that she get 3-D mammograms for surveillance. Discussed the differences between different breast density categories. Bone density pending. I recommended continuing antiestrogen therapy for a total duration of 5 years.  Return to clinic in 1 year for follow-up.

## 2015-12-01 NOTE — Progress Notes (Signed)
65 y.o. G0P0 Single  Caucasian Fe here for annual exam.  No new health problems.  Saw oncologist today and Mammo was done today.    Patient's last menstrual period was 06/29/2006.          Sexually active: No.  The current method of family planning is postmenopausal.    Exercising: Yes.    walking Smoker:  no  Health Maintenance: Pap:  11/24/2014 normal MMG:  12/01/2015 Bi Rads 2 diagnostic in 1 yr. Colonoscopy:  2014 polyp repeat in 5 yrs. BMD:   12/08/2011 TDaP:  05/2015 Shingles: no Pneumonia: no Hep C and HIV: done today Labs: done at cancer center   reports that she quit smoking about 3 years ago. Her smoking use included Cigarettes. She has a 67.5 pack-year smoking history. She has never used smokeless tobacco. She reports that she does not drink alcohol or use illicit drugs.  Past Medical History  Diagnosis Date  . Wears dentures     upper denture  . Arthritis     knees  . Breast cancer ([place]) 12/2011    right breast  . S/P radiation therapy 02/16/12 - 03/20/12    Right Breast  . Use of anastrozole (Arimidex) 04/16/12  . Hyperlipidemia 2014  . Osteopenia 12/08/2011    of spine and hip  . Vitamin D deficiency disease   . Former smoker quit 12/2011    Past Surgical History  Procedure Laterality Date  . Ovarian cyst removal Right 1991  . Anterior cervical discectomy  06/12/2006    and fusion C6-7  . Hysteroscopy w/d&c  12/13/2005    for uterine polyps  . Mastectomy partial / lumpectomy w/ axillary lymphadenectomy  01/20/2012    right breast lumpectomy with sentinel lymph node biopsy. T1N0, ER/PR + Her 2/neu negative, radiation X 29 treatments.  . Appendectomy  1991  . Breast lumpectomy Right 1979    benign  . Breast biopsy Right 01/02/2012    Needle core biopsy - invasive ductal cancer  . Neck surgery  ~2006    Current Outpatient Prescriptions  Medication Sig Dispense Refill  . anastrozole (ARIMIDEX) 1 MG tablet Take 1 tablet (1 mg total) by mouth daily. 90 tablet 3  .  atorvastatin (LIPITOR) 10 MG tablet Take 10 mg by mouth daily.    Marland Kitchen buPROPion (WELLBUTRIN XL) 150 MG 24 hr tablet Take 1 tablet (150 mg total) by mouth daily. 90 tablet 3  . calcium-vitamin D (OSCAL WITH D) 500-200 MG-UNIT per tablet Take 2 tablets by mouth daily. PM    . meloxicam (MOBIC) 15 MG tablet Take 15 mg by mouth daily.    . methimazole (TAPAZOLE) 5 MG tablet Take 5 mg by mouth daily.    . Vitamin D, Ergocalciferol, (DRISDOL) 50000 units CAPS capsule Take 1 capsule (50,000 Units total) by mouth every 7 (seven) days. 30 capsule 3   No current facility-administered medications for this visit.    Family History  Problem Relation Age of Onset  . Ovarian cancer Sister   . Uterine cancer Sister   . Ovarian cancer Sister 16    ovarian and uterine  . Lung cancer Maternal Aunt   . Lung cancer Maternal Aunt     lung  . Uterine cancer Sister 30    uterine  . Pancreatic cancer Brother 5  . Bladder Cancer Sister 84    ROS:  Pertinent items are noted in HPI.  Otherwise, a comprehensive ROS was negative.  Exam:   BP  110/80 mmHg  Pulse 84  Resp 18  Ht 5' 5.5" (1.664 m)  Wt 183 lb (83.008 kg)  BMI 29.98 kg/m2  LMP 06/29/2006 Height: 5' 5.5" (166.4 cm) Ht Readings from Last 3 Encounters:  12/01/15 5' 5.5" (1.664 m)  12/01/15 5' 6.25" (1.683 m)  11/24/14 5' 6.25" (1.683 m)    General appearance: alert, cooperative and appears stated age Head: Normocephalic, without obvious abnormality, atraumatic Neck: no adenopathy, supple, symmetrical, trachea midline and thyroid normal to inspection and palpation Lungs: clear to auscultation bilaterally, slight increase in Kyphosis Breasts: normal appearance, no masses or tenderness, on the left.  Right Breast with surgical and radiation changes. Heart: regular rate and rhythm Abdomen: soft, non-tender; no masses,  no organomegaly Extremities: extremities normal, atraumatic, no cyanosis or edema Skin: Skin color, texture, turgor normal. No  rashes or lesions Lymph nodes: Cervical, supraclavicular, and axillary nodes normal. No abnormal inguinal nodes palpated Neurologic: Grossly normal   Pelvic: External genitalia:  no lesions              Urethra:  normal appearing urethra with no masses, tenderness or lesions              Bartholin's and Skene's: normal                 Vagina: atrophic appearing vagina with normal color and discharge, no lesions              Cervix: anteverted              Pap taken: No. Bimanual Exam:  Uterus:  normal size, contour, position, consistency, mobility, non-tender              Adnexa: no mass, fullness, tenderness               Rectovaginal: Confirms               Anus:  normal sphincter tone, no lesions  Chaperone present: no  A:  Well Woman with normal exam  Postmenopausal S/P right breast lumpectomy secondary to cancer 01/20/2012 History of Vit D deficiency Osteopenia - stable History of Depression - doing well on current treatment   P:   Reviewed health and wellness pertinent to exam  Pap smear as above  Mammogram is due 11/2016 and will get a repeat BMD at that time  Refill on Vit D and Wellbutrin for a year  Follow with labs  Counseled on breast self exam, mammography screening, adequate intake of calcium and vitamin D, diet and exercise, Kegel's exercises return annually or prn  An After Visit Summary was printed and given to the patient.

## 2015-12-01 NOTE — Telephone Encounter (Signed)
appt made and avs printed °

## 2015-12-01 NOTE — Progress Notes (Signed)
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 Pablo Ledger   04/16/2012 -  Anti-estrogen oral therapy Arimidex 1 mg by mouth daily 5 years    CHIEF COMPLIANT: Follow-up on anastrozole  INTERVAL HISTORY: Cindy Skinner is a 65 year old with above-mentioned history of right breast cancer currently on adjuvant anastrozole therapy. She has been on it since August 2013. She does not report any hot flashes or myalgias. She does have some arthritic symptoms which she attributes to aging. She denies any lumps or nodules in the breast. Recent mammogram was normal.  REVIEW OF SYSTEMS:   Constitutional: Denies fevers, chills or abnormal weight loss Eyes: Denies blurriness of vision Ears, nose, mouth, throat, and face: Denies mucositis or sore throat Respiratory: Denies cough, dyspnea or wheezes Cardiovascular: Denies palpitation, chest discomfort Gastrointestinal:  Denies nausea, heartburn or change in bowel habits Skin: Denies abnormal skin rashes Lymphatics: Denies new lymphadenopathy or easy bruising Neurological:Denies numbness, tingling or new  weaknesses Behavioral/Psych: Mood is stable, no new changes  Extremities: No lower extremity edema Breast:  denies any pain or lumps or nodules in either breasts All other systems were reviewed with the patient and are negative.  I have reviewed the past medical history, past surgical history, social history and family history with the patient and they are unchanged from previous note.  ALLERGIES:  is allergic to percocet and vicodin.  MEDICATIONS:  Current Outpatient Prescriptions  Medication Sig Dispense Refill  . anastrozole (ARIMIDEX) 1 MG tablet Take 1 tablet (1 mg total) by mouth daily. 30 tablet 0  . atorvastatin (LIPITOR) 10 MG tablet Take 10 mg by mouth daily.    Marland Kitchen buPROPion (WELLBUTRIN XL) 150 MG 24 hr tablet Take 1 tablet (150 mg total) by mouth daily. 90 tablet 3  . calcium-vitamin D (OSCAL WITH D) 500-200 MG-UNIT per tablet Take 2 tablets by mouth daily. PM    . meloxicam (MOBIC) 15 MG tablet Take 15 mg by mouth daily.    . methimazole (TAPAZOLE) 5 MG tablet Take 5 mg by mouth daily.    . Vitamin D, Ergocalciferol, (DRISDOL) 50000 UNITS CAPS capsule Take 1 capsule (50,000 Units total) by mouth every 7 (seven) days. 30 capsule 3   No current facility-administered medications for this visit.    PHYSICAL EXAMINATION: ECOG PERFORMANCE STATUS: 0 - Asymptomatic  Filed Vitals:   12/01/15 1054  BP: 97/67  Pulse: 84  Temp: 98 F (36.7 C)  Resp: 18   Filed Weights   12/01/15 1054  Weight: 185 lb 3.2 oz (84.006 kg)    GENERAL:alert, no distress and comfortable SKIN: skin color, texture, turgor are normal, no rashes  or significant lesions EYES: normal, Conjunctiva are pink and non-injected, sclera clear OROPHARYNX:no exudate, no erythema and lips, buccal mucosa, and tongue normal  NECK: supple, thyroid normal size, non-tender, without nodularity LYMPH:  no palpable lymphadenopathy in the cervical, axillary or inguinal LUNGS: clear to auscultation and percussion with  normal breathing effort HEART: regular rate & rhythm and no murmurs and no lower extremity edema ABDOMEN:abdomen soft, non-tender and normal bowel sounds MUSCULOSKELETAL:no cyanosis of digits and no clubbing  NEURO: alert & oriented x 3 with fluent speech, no focal motor/sensory deficits EXTREMITIES: No lower extremity edema BREAST: No palpable masses or nodules in either right or left breasts. No palpable axillary supraclavicular or infraclavicular adenopathy no breast tenderness or nipple discharge. (exam performed in the presence of a chaperone)  LABORATORY DATA:  I have reviewed the data as listed   Chemistry      Component Value Date/Time   NA 143 07/18/2012 0829   NA 142 01/11/2012 0805   K 4.2 07/18/2012 0829   K 3.7 01/11/2012 0805   CL 108* 07/18/2012 0829   CL 103 01/11/2012 0805   CO2 28 07/18/2012 0829   CO2 30 01/11/2012 0805   BUN 17.0 07/18/2012 0829   BUN 16 01/11/2012 0805   CREATININE 0.8 07/18/2012 0829   CREATININE 0.87 01/11/2012 0805      Component Value Date/Time   CALCIUM 10.0 07/18/2012 0829   CALCIUM 9.7 01/11/2012 0805   ALKPHOS 106 07/18/2012 0829   ALKPHOS 86 01/11/2012 0805   AST 29 07/18/2012 0829   AST 20 01/11/2012 0805   ALT 33 07/18/2012 0829   ALT 23 01/11/2012 0805   BILITOT 0.99 07/18/2012 0829   BILITOT 0.7 01/11/2012 0805       Lab Results  Component Value Date   WBC 4.3 12/01/2015   HGB 14.6 12/01/2015   HCT 43.6 12/01/2015   MCV 94.4 12/01/2015   PLT 208 12/01/2015   NEUTROABS 2.4 12/01/2015     ASSESSMENT & PLAN:  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  Breast Cancer Surveillance: 1. Breast exam 12/01/2015: Normal 2. Mammogram 11/24/2014: No abnormalities. Postsurgical changes. Breast Density Category C. I recommended that she get 3-D mammograms for surveillance. Discussed the differences between different breast density categories.  Osteopenia: Bone density is usually done by her gynecologist. I discussed with her that she needs bone density every other year. She will discuss this with her gynecologist when she meets her in the next few weeks.  I recommended continuing antiestrogen therapy for a total duration of 5 years.  Return to clinic in 1 year for follow-up.   No orders of the defined types were placed in this encounter.   The patient has a good understanding of the overall plan. she agrees with it. she will call with any problems that may develop before the next visit here.   Rulon Eisenmenger, MD 12/01/2015

## 2015-12-02 LAB — HEPATITIS C ANTIBODY: HCV Ab: NEGATIVE

## 2015-12-02 LAB — HIV ANTIBODY (ROUTINE TESTING W REFLEX): HIV 1&2 Ab, 4th Generation: NONREACTIVE

## 2015-12-02 LAB — VITAMIN D 25 HYDROXY (VIT D DEFICIENCY, FRACTURES): Vit D, 25-Hydroxy: 44 ng/mL (ref 30–100)

## 2015-12-06 NOTE — Progress Notes (Signed)
Reviewed personally.  M. Suzanne Kaiyden Simkin, MD.  

## 2016-10-15 ENCOUNTER — Ambulatory Visit (HOSPITAL_COMMUNITY)
Admission: EM | Admit: 2016-10-15 | Discharge: 2016-10-15 | Disposition: A | Discharge status: Home / Self Care | Payer: 59 | Attending: Family Medicine | Admitting: Family Medicine

## 2016-10-15 ENCOUNTER — Encounter (HOSPITAL_COMMUNITY): Payer: Self-pay | Admitting: Family Medicine

## 2016-10-15 DIAGNOSIS — E785 Hyperlipidemia, unspecified: Secondary | ICD-10-CM | POA: Insufficient documentation

## 2016-10-15 DIAGNOSIS — E559 Vitamin D deficiency, unspecified: Secondary | ICD-10-CM | POA: Insufficient documentation

## 2016-10-15 DIAGNOSIS — J029 Acute pharyngitis, unspecified: Secondary | ICD-10-CM | POA: Diagnosis not present

## 2016-10-15 DIAGNOSIS — Z853 Personal history of malignant neoplasm of breast: Secondary | ICD-10-CM | POA: Diagnosis not present

## 2016-10-15 DIAGNOSIS — M8580 Other specified disorders of bone density and structure, unspecified site: Secondary | ICD-10-CM | POA: Diagnosis not present

## 2016-10-15 DIAGNOSIS — R6889 Other general symptoms and signs: Secondary | ICD-10-CM

## 2016-10-15 DIAGNOSIS — Z87891 Personal history of nicotine dependence: Secondary | ICD-10-CM | POA: Insufficient documentation

## 2016-10-15 DIAGNOSIS — Z923 Personal history of irradiation: Secondary | ICD-10-CM | POA: Insufficient documentation

## 2016-10-15 DIAGNOSIS — Z885 Allergy status to narcotic agent status: Secondary | ICD-10-CM | POA: Diagnosis not present

## 2016-10-15 DIAGNOSIS — R509 Fever, unspecified: Secondary | ICD-10-CM | POA: Diagnosis present

## 2016-10-15 LAB — POCT RAPID STREP A: Streptococcus, Group A Screen (Direct): NEGATIVE

## 2016-10-15 MED ORDER — OSELTAMIVIR PHOSPHATE 75 MG PO CAPS: 75.0000 mg | ORAL_CAPSULE | Freq: Two times a day (BID) | ORAL | 0 refills | Status: AC

## 2016-10-15 MED ORDER — OSELTAMIVIR PHOSPHATE 75 MG PO CAPS: 75.0000 mg | ORAL_CAPSULE | Freq: Two times a day (BID) | ORAL | 0 refills | Status: DC

## 2016-10-15 NOTE — ED Triage Notes (Signed)
Pt here for fever and sore throat.

## 2016-10-15 NOTE — ED Provider Notes (Signed)
CSN: [id number]     Arrival date & time 10/15/16  1736 History   None    Chief Complaint  Patient presents with  . Fever  . Sore Throat   (Consider location/radiation/quality/duration/timing/severity/associated sxs/prior Treatment) Patient c/o fever and sore throat.     The history is provided by the patient.  Sore Throat  This is a new problem. The problem occurs constantly. The problem has not changed since onset.Nothing aggravates the symptoms. Nothing relieves the symptoms. She has tried nothing for the symptoms.    Past Medical History:  Diagnosis Date  . Arthritis    knees  . Breast cancer ([place]) 12/2011   right breast  . Former smoker quit 12/2011  . Hyperlipidemia 2014  . Osteopenia 12/08/2011   of spine and hip  . S/P radiation therapy 02/16/12 - 03/20/12   Right Breast  . Use of anastrozole (Arimidex) 04/16/12  . Vitamin D deficiency disease   . Wears dentures    upper denture   Past Surgical History:  Procedure Laterality Date  . ANTERIOR CERVICAL DISCECTOMY  06/12/2006   and fusion C6-7  . APPENDECTOMY  1991  . BREAST BIOPSY Right 01/02/2012   Needle core biopsy - invasive ductal cancer  . BREAST LUMPECTOMY Right 1979   benign  . HYSTEROSCOPY W/D&C  12/13/2005   for uterine polyps  . MASTECTOMY PARTIAL / LUMPECTOMY W/ AXILLARY LYMPHADENECTOMY  01/20/2012   right breast lumpectomy with sentinel lymph node biopsy. T1N0, ER/PR + Her 2/neu negative, radiation X 29 treatments.  Marland Kitchen NECK SURGERY  ~2006  . OVARIAN CYST REMOVAL Right 1991   Family History  Problem Relation Age of Onset  . Ovarian cancer Sister   . Uterine cancer Sister   . Lung cancer Maternal Aunt     lung  . Uterine cancer Sister 30    uterine  . Pancreatic cancer Brother 69  . Bladder Cancer Sister 71  . Ovarian cancer Sister 35    ovarian and uterine   Social History  Substance Use Topics  . Smoking status: Former Smoker    Packs/day: 1.50    Years: 45.00    Types: Cigarettes    Quit  date: 02/06/2012  . Smokeless tobacco: Never Used     Comment: 2-3 cig./day  . Alcohol use No   OB History    Gravida Para Term Preterm AB Living   0 0           SAB TAB Ectopic Multiple Live Births                 Review of Systems  Constitutional: Negative.   HENT: Positive for sore throat.   Eyes: Negative.   Respiratory: Negative.   Cardiovascular: Negative.   Gastrointestinal: Negative.   Endocrine: Negative.   Genitourinary: Negative.   Musculoskeletal: Negative.   Allergic/Immunologic: Negative.   Neurological: Negative.   Hematological: Negative.   Psychiatric/Behavioral: Negative.     Allergies  Percocet [oxycodone-acetaminophen] and Vicodin [hydrocodone-acetaminophen]  Home Medications   Prior to Admission medications   Medication Sig Start Date End Date Taking? Authorizing Provider  anastrozole (ARIMIDEX) 1 MG tablet Take 1 tablet (1 mg total) by mouth daily. 12/01/15   Nicholas Lose, MD  atorvastatin (LIPITOR) 10 MG tablet Take 10 mg by mouth daily.    Historical Provider, MD  buPROPion (WELLBUTRIN XL) 150 MG 24 hr tablet Take 1 tablet (150 mg total) by mouth daily. 12/01/15   Kem Boroughs, FNP  calcium-vitamin D (  OSCAL WITH D) 500-200 MG-UNIT per tablet Take 2 tablets by mouth daily. PM    Historical Provider, MD  meloxicam (MOBIC) 15 MG tablet Take 15 mg by mouth daily.    Historical Provider, MD  methimazole (TAPAZOLE) 5 MG tablet Take 5 mg by mouth daily.    Historical Provider, MD  oseltamivir (TAMIFLU) 75 MG capsule Take 1 capsule (75 mg total) by mouth every 12 (twelve) hours. 10/15/16   Lysbeth Penner, FNP  Vitamin D, Ergocalciferol, (DRISDOL) 50000 units CAPS capsule Take 1 capsule (50,000 Units total) by mouth every 7 (seven) days. 12/01/15   Kem Boroughs, FNP   Meds Ordered and Administered this Visit  Medications - No data to display  LMP 06/29/2006  No data found.   Physical Exam  Constitutional: She is oriented to person, place, and time. She  appears well-developed and well-nourished.  HENT:  Head: Normocephalic and atraumatic.  Right Ear: External ear normal.  Left Ear: External ear normal.  Mouth/Throat: Oropharynx is clear and moist.  Eyes: Conjunctivae and EOM are normal. Pupils are equal, round, and reactive to light.  Neck: Normal range of motion. Neck supple.  Cardiovascular: Normal rate, regular rhythm and normal heart sounds.   Pulmonary/Chest: Effort normal and breath sounds normal.  Abdominal: Soft. Bowel sounds are normal.  Neurological: She is alert and oriented to person, place, and time.  Nursing note and vitals reviewed.   Urgent Care Course     Procedures (including critical care time)  Labs Review Labs Reviewed  POCT RAPID STREP A    Imaging Review No results found.   Visual Acuity Review  Right Eye Distance:   Left Eye Distance:   Bilateral Distance:    Right Eye Near:   Left Eye Near:    Bilateral Near:         MDM   1. Sore throat   2. Fever chills   3. Flu-like symptoms    Tamiflu 75mg  one po bid x 5 days #10  Push po fluids, rest, tylenol and motrin otc prn as directed for fever, arthralgias, and myalgias.  Follow up prn if sx's continue or persist.    Lysbeth Penner, FNP 10/15/16 [place], FNP 10/15/16 [phone]

## 2016-10-18 LAB — CULTURE, GROUP A STREP (THRC)

## 2016-11-06 ENCOUNTER — Telehealth: Payer: Self-pay

## 2016-11-06 NOTE — Telephone Encounter (Signed)
Spoke with patient to r/s her 4/4 appt and she states that she will call back to r/s at a later date to see whrn she will be in town  Ason Heslin

## 2016-11-30 ENCOUNTER — Ambulatory Visit: Payer: Commercial Managed Care - HMO | Admitting: Hematology and Oncology

## 2016-12-01 ENCOUNTER — Ambulatory Visit: Payer: Commercial Managed Care - HMO

## 2016-12-02 ENCOUNTER — Ambulatory Visit: Payer: Commercial Managed Care - HMO | Admitting: Nurse Practitioner

## 2016-12-02 ENCOUNTER — Ambulatory Visit: Payer: Commercial Managed Care - HMO

## 2016-12-02 ENCOUNTER — Ambulatory Visit: Payer: Commercial Managed Care - HMO | Admitting: Obstetrics and Gynecology

## 2016-12-26 ENCOUNTER — Other Ambulatory Visit: Payer: Self-pay | Admitting: Nurse Practitioner

## 2016-12-27 ENCOUNTER — Telehealth: Payer: Self-pay | Admitting: *Deleted

## 2016-12-27 NOTE — Telephone Encounter (Signed)
Medication refill request: Wellbutrin   Last AEX:  12-01-15  Next JQG:BEEFEO patient to schedule appointment- patient states she has moved and is requesting a refill till she gets in with new provider Last MMG (if hormonal medication request): 12-01-15 WNL  Refill authorized: please advise

## 2016-12-27 NOTE — Telephone Encounter (Signed)
Patient in 04 recall for 11/2016. Patient was scheduled 12/07/16 but cancelled because she has moved away. Please contact patient regarding recall status Thanks

## 2016-12-28 NOTE — Telephone Encounter (Signed)
Per cancellation reason on 12/07/16 mammogram appointment patient has relocated.  New address in system for [name], [place]. No request for medical records at this time.  Please advise if OK to remove from recall.

## 2016-12-29 NOTE — Telephone Encounter (Signed)
I think a letter reminder her about the need for follow-up and that we can schedule the appt if she needs would be appropriate.  Then can remove from recall.  I can review before sending.  Thanks.

## 2017-01-03 ENCOUNTER — Encounter: Payer: Self-pay | Admitting: *Deleted

## 2017-01-03 NOTE — Telephone Encounter (Signed)
Letter signed.  Ok to send and remove from any recall.

## 2017-01-03 NOTE — Telephone Encounter (Signed)
Letter to your desk for review.  

## 2017-01-03 NOTE — Telephone Encounter (Signed)
Letter mailed certified and regular [place] mail. Recalls completed. Encounter closed.

## 2017-01-21 ENCOUNTER — Other Ambulatory Visit: Payer: Self-pay | Admitting: Nurse Practitioner

## 2017-09-14 ENCOUNTER — Other Ambulatory Visit: Payer: Self-pay | Admitting: *Deleted

## 2017-09-14 NOTE — Telephone Encounter (Signed)
Medication refill request: wellbutrin  Last AEX:  12/01/15 PG  Next AEX: 02/06/18  Last MMG (if hormonal medication request): 12/01/15 BIRADS 2 benign  Refill authorized: 12/27/16 #90, 1 RF. Today, please advise.

## 2017-09-18 NOTE — Telephone Encounter (Signed)
Message left to return call to Leoncio Hansen at [phone].    

## 2017-09-18 NOTE — Telephone Encounter (Signed)
I do not feel comfortable refilling, because I have never seen patient and it has been almost 2 years since she has been seen. Also noted to have had cancer. She needs phone call to see if can be managed by PCP until she is seen by [place] or they can manage it.

## 2017-09-25 NOTE — Telephone Encounter (Signed)
Call to patient. Message given to patient as seen below from Melvia Heaps, CNM. Patient verbalized understanding. Patient states she will have PCP, Thayer Jew in [place] refill her Wellbutrin for her until she is seen for her AEX in 01/2018. RN advised will update Melvia Heaps, CNM.   Routing to provider for final review. Patient agreeable to disposition. Will close encounter.

## 2017-11-07 ENCOUNTER — Telehealth: Payer: Self-pay | Admitting: Certified Nurse Midwife

## 2017-11-07 NOTE — Telephone Encounter (Signed)
Call to patient. Last seen 11-2015 with Edman Circle FNP. Patient states she feels like Vitamin D and/or hormones "are off" and she is interested in going to "[facility]" clinic in [place] that specializes in hormone therapy.  Patient wants to know if this is recommended and if she can take hormones. Advised we cannot give an opinion on this clinic and that due to her history, she would need to check with oncology regarding any hormones. Although hormones may not be an option, there may be other options depending on symptoms. Recommended office visit here to evaluate. Patient is scheduled in June for annual with Debbi Hollice Espy CNM and declines earlier appointment due to work. Suggested PCP for check of thyroid and medical work-up of fatigue before checking hormones. Patient states she did this two months ago and all was normal, thyroid medication was refilled.  Encouraged patient to bring all labs to appointment in June and to check with PCP or oncology before beginning any new medication.   Routing to provider for final review. Patient agreeable to disposition. Will close encounter.

## 2017-11-07 NOTE — Telephone Encounter (Signed)
Patient has some questions about hormones.

## 2017-11-07 NOTE — Telephone Encounter (Signed)
Left message to call Gurvir Schrom at [phone]. 

## 2018-02-06 ENCOUNTER — Ambulatory Visit: Payer: Commercial Managed Care - HMO | Admitting: Certified Nurse Midwife
